# Patient Record
Sex: Male | Born: 1945 | Race: White | Hispanic: No | Marital: Single | Smoking: Former smoker
Health system: Southern US, Community
[De-identification: ages and names within clinical notes are randomized; demographics above are authoritative.]

## PROBLEM LIST (undated history)

## (undated) DIAGNOSIS — Z87442 Personal history of urinary calculi: Secondary | ICD-10-CM

## (undated) DIAGNOSIS — K219 Gastro-esophageal reflux disease without esophagitis: Secondary | ICD-10-CM

## (undated) DIAGNOSIS — I209 Angina pectoris, unspecified: Secondary | ICD-10-CM

## (undated) DIAGNOSIS — I10 Essential (primary) hypertension: Secondary | ICD-10-CM

## (undated) DIAGNOSIS — E785 Hyperlipidemia, unspecified: Secondary | ICD-10-CM

## (undated) DIAGNOSIS — H409 Unspecified glaucoma: Secondary | ICD-10-CM

## (undated) DIAGNOSIS — H90A12 Conductive hearing loss, unilateral, left ear with restricted hearing on the contralateral side: Secondary | ICD-10-CM

## (undated) DIAGNOSIS — R7303 Prediabetes: Secondary | ICD-10-CM

## (undated) DIAGNOSIS — K4090 Unilateral inguinal hernia, without obstruction or gangrene, not specified as recurrent: Secondary | ICD-10-CM

## (undated) DIAGNOSIS — I219 Acute myocardial infarction, unspecified: Secondary | ICD-10-CM

## (undated) DIAGNOSIS — I2510 Atherosclerotic heart disease of native coronary artery without angina pectoris: Secondary | ICD-10-CM

## (undated) DIAGNOSIS — K635 Polyp of colon: Secondary | ICD-10-CM

## (undated) DIAGNOSIS — Z955 Presence of coronary angioplasty implant and graft: Secondary | ICD-10-CM

## (undated) DIAGNOSIS — R972 Elevated prostate specific antigen [PSA]: Secondary | ICD-10-CM

## (undated) DIAGNOSIS — N400 Enlarged prostate without lower urinary tract symptoms: Secondary | ICD-10-CM

## (undated) HISTORY — PX: CARDIAC CATHETERIZATION: SHX172

## (undated) HISTORY — PX: OTHER SURGICAL HISTORY: SHX169

## (undated) HISTORY — PX: VASECTOMY: SHX75

---

## 2011-01-16 ENCOUNTER — Emergency Department: Payer: Self-pay | Admitting: *Deleted

## 2011-01-27 ENCOUNTER — Ambulatory Visit: Payer: Self-pay | Admitting: Internal Medicine

## 2011-05-08 ENCOUNTER — Ambulatory Visit: Payer: Self-pay

## 2011-05-19 ENCOUNTER — Ambulatory Visit: Payer: Self-pay | Admitting: Internal Medicine

## 2016-09-28 DIAGNOSIS — I219 Acute myocardial infarction, unspecified: Secondary | ICD-10-CM

## 2016-09-28 HISTORY — DX: Acute myocardial infarction, unspecified: I21.9

## 2017-04-28 DIAGNOSIS — R7302 Impaired glucose tolerance (oral): Secondary | ICD-10-CM | POA: Insufficient documentation

## 2017-05-01 ENCOUNTER — Encounter: Payer: Self-pay | Admitting: Internal Medicine

## 2017-05-01 ENCOUNTER — Inpatient Hospital Stay
Admission: EM | Admit: 2017-05-01 | Discharge: 2017-05-04 | DRG: 247 | Disposition: A | Discharge status: Home / Self Care | Payer: Medicare Other | Attending: Internal Medicine | Admitting: Internal Medicine

## 2017-05-01 ENCOUNTER — Emergency Department: Payer: Medicare Other

## 2017-05-01 ENCOUNTER — Encounter
Admission: EM | Disposition: A | Discharge status: Home / Self Care | Payer: Self-pay | Source: Home / Self Care | Attending: Internal Medicine

## 2017-05-01 DIAGNOSIS — I2109 ST elevation (STEMI) myocardial infarction involving other coronary artery of anterior wall: Secondary | ICD-10-CM | POA: Diagnosis present

## 2017-05-01 DIAGNOSIS — R7303 Prediabetes: Secondary | ICD-10-CM | POA: Diagnosis present

## 2017-05-01 DIAGNOSIS — I252 Old myocardial infarction: Secondary | ICD-10-CM | POA: Insufficient documentation

## 2017-05-01 DIAGNOSIS — Z7982 Long term (current) use of aspirin: Secondary | ICD-10-CM

## 2017-05-01 DIAGNOSIS — I10 Essential (primary) hypertension: Secondary | ICD-10-CM | POA: Diagnosis present

## 2017-05-01 DIAGNOSIS — I25119 Atherosclerotic heart disease of native coronary artery with unspecified angina pectoris: Secondary | ICD-10-CM | POA: Diagnosis present

## 2017-05-01 DIAGNOSIS — I429 Cardiomyopathy, unspecified: Secondary | ICD-10-CM

## 2017-05-01 DIAGNOSIS — I2510 Atherosclerotic heart disease of native coronary artery without angina pectoris: Secondary | ICD-10-CM

## 2017-05-01 DIAGNOSIS — E785 Hyperlipidemia, unspecified: Secondary | ICD-10-CM | POA: Diagnosis present

## 2017-05-01 DIAGNOSIS — Z8249 Family history of ischemic heart disease and other diseases of the circulatory system: Secondary | ICD-10-CM

## 2017-05-01 DIAGNOSIS — I255 Ischemic cardiomyopathy: Secondary | ICD-10-CM | POA: Diagnosis present

## 2017-05-01 DIAGNOSIS — I213 ST elevation (STEMI) myocardial infarction of unspecified site: Secondary | ICD-10-CM | POA: Diagnosis present

## 2017-05-01 HISTORY — PX: CORONARY/GRAFT ACUTE MI REVASCULARIZATION: CATH118305

## 2017-05-01 HISTORY — PX: CORONARY BALLOON ANGIOPLASTY: CATH118233

## 2017-05-01 HISTORY — PX: LEFT HEART CATH AND CORONARY ANGIOGRAPHY: CATH118249

## 2017-05-01 HISTORY — DX: ST elevation (STEMI) myocardial infarction involving other coronary artery of anterior wall: I21.09

## 2017-05-01 HISTORY — DX: Atherosclerotic heart disease of native coronary artery without angina pectoris: I25.10

## 2017-05-01 HISTORY — DX: Cardiomyopathy, unspecified: I42.9

## 2017-05-01 LAB — COMPREHENSIVE METABOLIC PANEL
ALK PHOS: 110 U/L (ref 38–126)
ALT: 22 U/L (ref 17–63)
ANION GAP: 9 (ref 5–15)
AST: 26 U/L (ref 15–41)
Albumin: 4.3 g/dL (ref 3.5–5.0)
BILIRUBIN TOTAL: 1 mg/dL (ref 0.3–1.2)
BUN: 24 mg/dL — ABNORMAL HIGH (ref 6–20)
CALCIUM: 9.6 mg/dL (ref 8.9–10.3)
CO2: 24 mmol/L (ref 22–32)
Chloride: 105 mmol/L (ref 101–111)
Creatinine, Ser: 1.04 mg/dL (ref 0.61–1.24)
GFR calc non Af Amer: >60 mL/min (ref >60)
Glucose, Bld: 105 mg/dL — ABNORMAL HIGH (ref 65–99)
POTASSIUM: 3.9 mmol/L (ref 3.5–5.1)
Sodium: 138 mmol/L (ref 135–145)
TOTAL PROTEIN: 7.9 g/dL (ref 6.5–8.1)

## 2017-05-01 LAB — CBC WITH DIFFERENTIAL/PLATELET
BASOS PCT: 1 %
Basophils Absolute: 0.1 10*3/uL (ref 0–0.1)
EOS ABS: 0.2 10*3/uL (ref 0–0.7)
EOS PCT: 2 %
HCT: 48.1 % (ref 40.0–52.0)
HEMOGLOBIN: 16.6 g/dL (ref 13.0–18.0)
LYMPHS PCT: 19 %
Lymphs Abs: 1.8 10*3/uL (ref 1.0–3.6)
MCH: 29.7 pg (ref 26.0–34.0)
MCHC: 34.5 g/dL (ref 32.0–36.0)
MCV: 86.1 fL (ref 80.0–100.0)
MONO ABS: 1 10*3/uL (ref 0.2–1.0)
MONOS PCT: 10 %
Neutro Abs: 6.5 10*3/uL (ref 1.4–6.5)
Neutrophils Relative %: 68 %
Platelets: 237 10*3/uL (ref 150–440)
RBC: 5.58 MIL/uL (ref 4.40–5.90)
RDW: 13.4 % (ref 11.5–14.5)
WBC: 9.5 10*3/uL (ref 3.8–10.6)

## 2017-05-01 LAB — LIPID PANEL
Cholesterol: 239 mg/dL — ABNORMAL HIGH (ref 0–200)
HDL: 36 mg/dL — AB (ref >40)
LDL CALC: 175 mg/dL — AB (ref 0–99)
TRIGLYCERIDES: 141 mg/dL (ref <150)
Total CHOL/HDL Ratio: 6.6 RATIO
VLDL: 28 mg/dL (ref 0–40)

## 2017-05-01 LAB — APTT: aPTT: 31 seconds (ref 24–36)

## 2017-05-01 LAB — TROPONIN I: Troponin I: 0.11 ng/mL (ref <0.03)

## 2017-05-01 LAB — PROTIME-INR
INR: 1.06
PROTHROMBIN TIME: 13.8 s (ref 11.4–15.2)

## 2017-05-01 LAB — POCT ACTIVATED CLOTTING TIME: ACTIVATED CLOTTING TIME: 384 s

## 2017-05-01 LAB — CARDIAC CATHETERIZATION: Cath EF Quantitative: 35 %

## 2017-05-01 SURGERY — LEFT HEART CATH AND CORONARY ANGIOGRAPHY
Anesthesia: Moderate Sedation

## 2017-05-01 MED ORDER — BIVALIRUDIN TRIFLUOROACETATE 250 MG IV SOLR
INTRAVENOUS | Status: AC
  Filled 2017-05-01: qty 250

## 2017-05-01 MED ORDER — ASPIRIN 81 MG PO CHEW
243.0000 mg | CHEWABLE_TABLET | Freq: Once | ORAL | Status: AC
  Administered 2017-05-01: 243 mg via ORAL

## 2017-05-01 MED ORDER — ONDANSETRON HCL 4 MG/2ML IJ SOLN: 4.0000 mg | Freq: Four times a day (QID) | INTRAMUSCULAR | Status: DC | PRN

## 2017-05-01 MED ORDER — SODIUM CHLORIDE 0.9 % IV SOLN
INTRAVENOUS | Status: AC | PRN
  Administered 2017-05-01: 1.75 mg/kg/h via INTRAVENOUS

## 2017-05-01 MED ORDER — NITROGLYCERIN 0.4 MG SL SUBL
0.4000 mg | SUBLINGUAL_TABLET | SUBLINGUAL | Status: DC | PRN
  Administered 2017-05-01 (×2): 0.4 mg via SUBLINGUAL

## 2017-05-01 MED ORDER — HEPARIN SODIUM (PORCINE) 5000 UNIT/ML IJ SOLN: 4000.0000 [IU] | Freq: Once | INTRAMUSCULAR | Status: DC

## 2017-05-01 MED ORDER — HEPARIN (PORCINE) IN NACL 100-0.45 UNIT/ML-% IJ SOLN
1100.0000 [IU]/h | INTRAMUSCULAR | Status: DC
  Administered 2017-05-01: 950 [IU]/h via INTRAVENOUS
  Filled 2017-05-01 (×2): qty 250

## 2017-05-01 MED ORDER — LISINOPRIL 10 MG PO TABS
5.0000 mg | ORAL_TABLET | Freq: Two times a day (BID) | ORAL | Status: DC
  Administered 2017-05-01 – 2017-05-04 (×6): 5 mg via ORAL
  Filled 2017-05-01: qty 0.5
  Filled 2017-05-01: qty 1
  Filled 2017-05-01 (×3): qty 0.5
  Filled 2017-05-01 (×3): qty 1
  Filled 2017-05-01: qty 0.5
  Filled 2017-05-01 (×2): qty 1
  Filled 2017-05-01: qty 0.5

## 2017-05-01 MED ORDER — METOPROLOL TARTRATE 5 MG/5ML IV SOLN
5.0000 mg | Freq: Once | INTRAVENOUS | Status: AC
  Administered 2017-05-01: 5 mg via INTRAVENOUS

## 2017-05-01 MED ORDER — ACETAMINOPHEN 325 MG PO TABS: 650.0000 mg | ORAL_TABLET | ORAL | Status: DC | PRN

## 2017-05-01 MED ORDER — ATORVASTATIN CALCIUM 20 MG PO TABS
80.0000 mg | ORAL_TABLET | Freq: Every day | ORAL | Status: DC
  Administered 2017-05-02 – 2017-05-03 (×2): 80 mg via ORAL
  Filled 2017-05-01 (×2): qty 4

## 2017-05-01 MED ORDER — SODIUM CHLORIDE 0.9 % IV SOLN
0.2500 mg/kg/h | INTRAVENOUS | Status: DC
  Administered 2017-05-01: 0.25 mg/kg/h via INTRAVENOUS
  Filled 2017-05-01: qty 250

## 2017-05-01 MED ORDER — ASPIRIN 81 MG PO CHEW: 243.0000 mg | CHEWABLE_TABLET | Freq: Once | ORAL | Status: DC

## 2017-05-01 MED ORDER — METOPROLOL TARTRATE 5 MG/5ML IV SOLN
INTRAVENOUS | Status: AC
  Filled 2017-05-01: qty 5

## 2017-05-01 MED ORDER — SODIUM CHLORIDE 0.9% FLUSH: 3.0000 mL | INTRAVENOUS | Status: DC | PRN

## 2017-05-01 MED ORDER — IOPAMIDOL (ISOVUE-300) INJECTION 61%
INTRAVENOUS | Status: DC | PRN
  Administered 2017-05-01: 215 mL via INTRA_ARTERIAL

## 2017-05-01 MED ORDER — NITROGLYCERIN 5 MG/ML IV SOLN
INTRAVENOUS | Status: AC
  Filled 2017-05-01: qty 10

## 2017-05-01 MED ORDER — LIDOCAINE HCL (PF) 1 % IJ SOLN
INTRAMUSCULAR | Status: AC
  Filled 2017-05-01: qty 30

## 2017-05-01 MED ORDER — SODIUM CHLORIDE 0.9 % IV SOLN: 250.0000 mL | INTRAVENOUS | Status: DC | PRN

## 2017-05-01 MED ORDER — SODIUM CHLORIDE 0.9 % IV SOLN
Freq: Once | INTRAVENOUS | Status: AC
  Administered 2017-05-01: 21:00:00 via INTRAVENOUS

## 2017-05-01 MED ORDER — HEPARIN (PORCINE) IN NACL 2-0.9 UNIT/ML-% IJ SOLN
INTRAMUSCULAR | Status: AC
  Filled 2017-05-01: qty 500

## 2017-05-01 MED ORDER — HYDRALAZINE HCL 20 MG/ML IJ SOLN: 5.0000 mg | INTRAMUSCULAR | Status: DC | PRN

## 2017-05-01 MED ORDER — SODIUM CHLORIDE 0.9 % IV SOLN: INTRAVENOUS | Status: DC

## 2017-05-01 MED ORDER — BIVALIRUDIN BOLUS VIA INFUSION - CUPID
INTRAVENOUS | Status: DC | PRN
  Administered 2017-05-01: 58.5 mg via INTRAVENOUS

## 2017-05-01 MED ORDER — LABETALOL HCL 5 MG/ML IV SOLN: 10.0000 mg | INTRAVENOUS | Status: DC | PRN

## 2017-05-01 MED ORDER — HEPARIN SODIUM (PORCINE) 5000 UNIT/ML IJ SOLN
4000.0000 [IU] | Freq: Once | INTRAMUSCULAR | Status: AC
  Administered 2017-05-01: 4000 [IU] via INTRAVENOUS

## 2017-05-01 MED ORDER — CLOPIDOGREL BISULFATE 75 MG PO TABS
75.0000 mg | ORAL_TABLET | Freq: Every day | ORAL | Status: DC
  Administered 2017-05-02 – 2017-05-04 (×3): 75 mg via ORAL
  Filled 2017-05-01 (×3): qty 1

## 2017-05-01 MED ORDER — METOPROLOL TARTRATE 25 MG PO TABS
50.0000 mg | ORAL_TABLET | Freq: Two times a day (BID) | ORAL | Status: DC
  Administered 2017-05-01 – 2017-05-03 (×4): 50 mg via ORAL
  Filled 2017-05-01 (×4): qty 2

## 2017-05-01 MED ORDER — CLOPIDOGREL BISULFATE 75 MG PO TABS
600.0000 mg | ORAL_TABLET | Freq: Once | ORAL | Status: AC
  Administered 2017-05-01: 600 mg via ORAL

## 2017-05-01 MED ORDER — ASPIRIN 81 MG PO CHEW
81.0000 mg | CHEWABLE_TABLET | Freq: Every day | ORAL | Status: DC
  Administered 2017-05-02 – 2017-05-04 (×3): 81 mg via ORAL
  Filled 2017-05-01 (×3): qty 1

## 2017-05-01 MED ORDER — SODIUM CHLORIDE 0.9 % WEIGHT BASED INFUSION
1.0000 mL/kg/h | INTRAVENOUS | Status: AC
  Administered 2017-05-02: 1 mL/kg/h via INTRAVENOUS

## 2017-05-01 MED ORDER — SODIUM CHLORIDE 0.9% FLUSH
3.0000 mL | Freq: Two times a day (BID) | INTRAVENOUS | Status: DC
  Administered 2017-05-02 – 2017-05-03 (×4): 3 mL via INTRAVENOUS

## 2017-05-01 SURGICAL SUPPLY — 15 items
BALLN TREK RX 3.0X12 (BALLOONS) ×3
BALLOON TREK RX 3.0X12 (BALLOONS) ×1 IMPLANT
CATH 5FR JR4 DIAGNOSTIC (CATHETERS) ×3 IMPLANT
CATH INFINITI 5FR ANG PIGTAIL (CATHETERS) ×3 IMPLANT
CATH LAUNCHER 6FR EBU3.5 (CATHETERS) ×3 IMPLANT
CATH PRIORITY ONE AC 6F (CATHETERS) ×3 IMPLANT
DEVICE CLOSURE MYNXGRIP 6/7F (Vascular Products) ×3 IMPLANT
DEVICE INFLAT 30 PLUS (MISCELLANEOUS) ×3 IMPLANT
KIT MANI 3VAL PERCEP (MISCELLANEOUS) ×3 IMPLANT
NEEDLE PERC 18GX7CM (NEEDLE) ×3 IMPLANT
PACK CARDIAC CATH (CUSTOM PROCEDURE TRAY) ×3 IMPLANT
SHEATH AVANTI 6FR X 11CM (SHEATH) ×3 IMPLANT
STENT XIENCE ALPINE RX 3.25X23 (Permanent Stent) ×3 IMPLANT
WIRE EMERALD 3MM-J .035X150CM (WIRE) ×3 IMPLANT
WIRE G HI TQ BMW 190 (WIRE) ×3 IMPLANT

## 2017-05-01 NOTE — ED Provider Notes (Signed)
[facility] Emergency Department Provider Note  ____________________________________________   First MD Initiated Contact with Patient 05/01/17 2057     (approximate)  I have reviewed the triage vital signs and the nursing notes.   HISTORY  Chief Complaint Chest Pain and Code STEMI   HPI Joseph Esparza is a 71 y.o. male with a strong family history of cardiac disease was presenting to the emergency department today with a pressure-like pain across the front of his chest and weakness in his bilateral arms. He says that the sensation is lasting for the past several hours. However, he has had on and off pressure similar to this since this past Thursday. He is denying any diaphoresis, nausea vomiting or shortness of breath. Denies smoking. Says that he is not taking any medications but only takes an occasional aspirin. He says that he took one aspirin earlier today. Says that the chest pressure is a 3 out of 10 at this time.   No past medical history on file.  There are no active problems to display for this patient.   No past surgical history on file.  Prior to Admission medications   Not on File    Allergies Patient has no known allergies.  No family history on file.  Social History Social History  Substance Use Topics  . Smoking status: Not on file  . Smokeless tobacco: Not on file  . Alcohol use Not on file    Review of Systems  Constitutional: No fever/chills Eyes: No visual changes. ENT: No sore throat. Cardiovascular:as above Respiratory: Denies shortness of breath. Gastrointestinal: No abdominal pain.  No nausea, no vomiting.  No diarrhea.  No constipation. Genitourinary: Negative for dysuria. Musculoskeletal: Negative for back pain. Skin: Negative for rash. Neurological: Negative for headaches, focal weakness or numbness.   ____________________________________________   PHYSICAL EXAM:  VITAL SIGNS: ED Triage Vitals  Enc  Vitals Group     BP 05/01/17 2051 (!) 198/105     Pulse Rate 05/01/17 2051 80     Resp 05/01/17 2055 (!) 22     Temp 05/01/17 2051 97.9 F (36.6 C)     Temp Source 05/01/17 2051 Oral     SpO2 05/01/17 2051 94 %     Weight 05/01/17 2059 172 lb (78 kg)     Height 05/01/17 2059 5\' 10"  (1.778 m)     Head Circumference --      Peak Flow --      Pain Score 05/01/17 2108 2     Pain Loc --      Pain Edu? --      Excl. in [place]? --     Constitutional: Alert and oriented. Well appearing and in no acute distress. Eyes: Conjunctivae are normal.  Head: Atraumatic. Nose: No congestion/rhinnorhea. Mouth/Throat: Mucous membranes are moist.  Neck: No stridor.   Cardiovascular: Normal rate, regular rhythm. Grossly normal heart sounds.   Respiratory: Normal respiratory effort.  No retractions. Lungs CTAB. Gastrointestinal: Soft and nontender. No distention. Musculoskeletal: No lower extremity tenderness nor edema.  No joint effusions. Neurologic:  Normal speech and language. No gross focal neurologic deficits are appreciated. Skin:  Skin is warm, dry and intact. No rash noted. Psychiatric: Mood and affect are normal. Speech and behavior are normal.  ____________________________________________   LABS (all labs ordered are listed, but only abnormal results are displayed)  Labs Reviewed  CBC WITH DIFFERENTIAL/PLATELET  PROTIME-INR  APTT  COMPREHENSIVE METABOLIC PANEL  TROPONIN I  LIPID  PANEL  CBC WITH DIFFERENTIAL/PLATELET   ____________________________________________  EKG  ED ECG REPORT I, Doran Stabler, the attending physician, personally viewed and interpreted this ECG.   Date: 05/01/2017  EKG Time: 2057  Rate: 86  Rhythm: normal sinus rhythm  Axis: Normal  Intervals:none  ST&T Change: ST segment elevation in 1, aVL, V2 and 3 with reciprocal depression in 3 and aVF.  ED ECG REPORT I, Doran Stabler, the attending physician, personally viewed and interpreted this  ECG.   Date: 05/01/2017  EKG Time: 2050  Rate: 84  Rhythm: normal sinus rhythm  Axis: Normal  Intervals:none  ST&T Change: ST depressions in II, III, and F aVF with elevations in aVL, V2 through V5 with tombstone morphology. Consistent with STEMI.     ____________________________________________  RADIOLOGY   ____________________________________________   PROCEDURES  Procedure(s) performed:   Procedures  Critical Care performed:  CRITICAL CARE Performed by: Doran Stabler   Total critical care time: 35 minutes  Critical care time was exclusive of separately billable procedures and treating other patients.  Critical care was necessary to treat or prevent imminent or life-threatening deterioration.  Critical care was time spent personally by me on the following activities: development of treatment plan with patient and/or surrogate as well as nursing, discussions with consultants, evaluation of patient's response to treatment, examination of patient, obtaining history from patient or surrogate, ordering and performing treatments and interventions, ordering and review of laboratory studies, ordering and review of radiographic studies, pulse oximetry and re-evaluation of patient's condition.   ____________________________________________   INITIAL IMPRESSION / ASSESSMENT AND PLAN / ED COURSE  Pertinent labs & imaging results that were available during my care of the patient were reviewed by me and considered in my medical decision making (see chart for details).  ----------------------------------------- 9:13 PM on 05/01/2017 -----------------------------------------  Discussed the case with Dr. Clayborn Bigness who will be taking the patient to the catheterization lab. He recommends heparin, Plavix, aspirin Lopressor and nitroglycerin. Patient aware of the diagnosis as well as Cath Lab disposition. He is understanding willing to comply.       ____________________________________________   FINAL CLINICAL IMPRESSION(S) / ED DIAGNOSES  STEMI    NEW MEDICATIONS STARTED DURING THIS VISIT:  New Prescriptions   No medications on file     Note:  This document was prepared using Dragon voice recognition software and may include unintentional dictation errors.     Orbie Pyo, MD 05/01/17 2113

## 2017-05-01 NOTE — Consult Note (Signed)
Reason for Consult: STEMI anterior wall acute Referring Physician: Dr. Dineen Kid emergency room, Dr. Estanislado Pandy hospitalist  Joseph Esparza is an 71 y.o. male.  HPI: 71 year old white male in past medical history nonsmoker started having stuttering chest pain over the last 2-3 days midsternal with diaphoresis shortness of breath fatigue, weakness. Patient states she's had progressive recurrent symptoms about an hour prior to presentation he got progressively worse atrial himself to the emergency room. Upon evaluation the patient was found to have significant ST elevation anteriorly in the emergency Dr. activated code STEMI.. For evaluation and consultation with the patient and patient agreed to proceed with cardiac catheter for further assessment and intervention for intervention. Patient's pain was mild-to-moderate time he wasn't short of breath and appeared to be hemodynamically stable except for elevated blood pressure initially was over 200 he was down to systolic of 409. Denied any blackout spells or syncope no nausea vomiting to small diaphoresis  No past medical history on file.  No past surgical history on file.  No family history on file.  Social History:  has no tobacco, alcohol, and drug history on file.  Allergies: No Known Allergies  Medications: I have reviewed the patient's current medications.  Results for orders placed or performed during the hospital encounter of 05/01/17 (from the past 48 hour(s))  CBC with Differential/Platelet     Status: None   Collection Time: 05/01/17  8:56 PM  Result Value Ref Range   WBC 9.5 3.8 - 10.6 K/uL   RBC 5.58 4.40 - 5.90 MIL/uL   Hemoglobin 16.6 13.0 - 18.0 g/dL   HCT 48.1 40.0 - 52.0 %   MCV 86.1 80.0 - 100.0 fL   MCH 29.7 26.0 - 34.0 pg   MCHC 34.5 32.0 - 36.0 g/dL   RDW 13.4 11.5 - 14.5 %   Platelets 237 150 - 440 K/uL   Neutrophils Relative % 68 %   Neutro Abs 6.5 1.4 - 6.5 K/uL   Lymphocytes Relative 19 %   Lymphs Abs 1.8 1.0 - 3.6  K/uL   Monocytes Relative 10 %   Monocytes Absolute 1.0 0.2 - 1.0 K/uL   Eosinophils Relative 2 %   Eosinophils Absolute 0.2 0 - 0.7 K/uL   Basophils Relative 1 %   Basophils Absolute 0.1 0 - 0.1 K/uL  Protime-INR     Status: None   Collection Time: 05/01/17  8:56 PM  Result Value Ref Range   Prothrombin Time 13.8 11.4 - 15.2 seconds   INR 1.06   APTT     Status: None   Collection Time: 05/01/17  8:56 PM  Result Value Ref Range   aPTT 31 24 - 36 seconds  Comprehensive metabolic panel     Status: Abnormal   Collection Time: 05/01/17  8:56 PM  Result Value Ref Range   Sodium 138 135 - 145 mmol/L   Potassium 3.9 3.5 - 5.1 mmol/L   Chloride 105 101 - 111 mmol/L   CO2 24 22 - 32 mmol/L   Glucose, Bld 105 (H) 65 - 99 mg/dL   BUN 24 (H) 6 - 20 mg/dL   Creatinine, Ser 1.04 0.61 - 1.24 mg/dL   Calcium 9.6 8.9 - 10.3 mg/dL   Total Protein 7.9 6.5 - 8.1 g/dL   Albumin 4.3 3.5 - 5.0 g/dL   AST 26 15 - 41 U/L   ALT 22 17 - 63 U/L   Alkaline Phosphatase 110 38 - 126 U/L   Total Bilirubin 1.0  0.3 - 1.2 mg/dL   GFR calc non Af Amer >60 >60 mL/min   GFR calc Af Amer >60 >60 mL/min    Comment: (NOTE) The eGFR has been calculated using the CKD EPI equation. This calculation has not been validated in all clinical situations. eGFR's persistently <60 mL/min signify possible Chronic Kidney Disease.    Anion gap 9 5 - 15  Troponin I     Status: Abnormal   Collection Time: 05/01/17  8:56 PM  Result Value Ref Range   Troponin I 0.11 (HH) <0.03 ng/mL    Comment: CRITICAL RESULT CALLED TO, READ BACK BY AND VERIFIED WITH DELICIA TYLER 01/[birth year]/79 @ 2159  [place]   Lipid panel     Status: Abnormal   Collection Time: 05/01/17  8:56 PM  Result Value Ref Range   Cholesterol 239 (H) 0 - 200 mg/dL   Triglycerides 141 <150 mg/dL   HDL 36 (L) >40 mg/dL   Total CHOL/HDL Ratio 6.6 RATIO   VLDL 28 0 - 40 mg/dL   LDL Cholesterol 175 (H) 0 - 99 mg/dL    Comment:        Total Cholesterol/HDL:CHD  Risk Coronary Heart Disease Risk Table                     Men   Women  1/2 Average Risk   3.4   3.3  Average Risk       5.0   4.4  2 X Average Risk   9.6   7.1  3 X Average Risk  23.4   11.0        Use the calculated Patient Ratio above and the CHD Risk Table to determine the patient's CHD Risk.        ATP III CLASSIFICATION (LDL):  <100     mg/dL   Optimal  100-129  mg/dL   Near or Above                    Optimal  130-159  mg/dL   Borderline  160-189  mg/dL   High  >190     mg/dL   Very High   POCT Activated clotting time     Status: None   Collection Time: 05/01/17 10:11 PM  Result Value Ref Range   Activated Clotting Time 384 seconds    Dg Chest Port 1 View  Result Date: 05/01/2017 CLINICAL DATA:  Acute onset of generalized chest pain. Initial encounter. EXAM: PORTABLE CHEST 1 VIEW COMPARISON:  None. FINDINGS: The lungs are well-aerated and clear. There is no evidence of focal opacification, pleural effusion or pneumothorax. The cardiomediastinal silhouette is mildly enlarged. No acute osseous abnormalities are seen. External pacing pads are noted. IMPRESSION: Mild cardiomegaly.  Lungs remain grossly clear. Electronically Signed   By: Garald Balding M.D.   On: 05/01/2017 21:25    Review of Systems  Constitutional: Positive for diaphoresis and malaise/fatigue.  HENT: Positive for congestion.   Eyes: Negative.   Respiratory: Positive for shortness of breath.   Cardiovascular: Positive for chest pain and palpitations.  Gastrointestinal: Negative.   Genitourinary: Negative.   Musculoskeletal: Negative.   Skin: Negative.   Neurological: Positive for weakness.  Endo/Heme/Allergies: Negative.   Psychiatric/Behavioral: Negative.    Blood pressure (!) 174/114, pulse 71, temperature 97.9 F (36.6 C), temperature source Oral, resp. rate 10, height '5\' 10"'$  (1.778 m), weight 78 kg (172 lb), SpO2 97 %. Physical Exam  Nursing note and vitals  reviewed. Constitutional: He is  oriented to person, place, and time. He appears well-developed and well-nourished.  HENT:  Head: Normocephalic and atraumatic.  Eyes: Pupils are equal, round, and reactive to light. Conjunctivae and EOM are normal.  Neck: Normal range of motion. Neck supple.  Cardiovascular: Normal rate and regular rhythm.  Exam reveals gallop.   Murmur heard. Respiratory: Effort normal and breath sounds normal.  GI: Soft. Bowel sounds are normal.  Musculoskeletal: Normal range of motion.  Neurological: He is alert and oriented to person, place, and time. He has normal reflexes.  Skin: Skin is warm and dry.  Psychiatric: He has a normal mood and affect.    Assessment/Plan: STEMI anterior wall acute Abnormal EKG Hypertension Coronary artery disease Ischemic cardiomyopathy . Plan Agree with admission for STEMI Proceed with cardiac catheter lab with intention to treat Beta-blockade therapy to help with hypertension Agree with anticoagulation initially with heparin Aspirin Plavix should be given prior to catheter Consider echocardiogram for further assessment Follow-up EKGs and troponins Recommend ACE inhibitor therapy for hypertension control Start Lipitor 80 mg once a day for lipid management Proceed directly to cardiac catheter with potential intervention with probably an LAD lesion   Dwayne D Callwood 05/01/2017, 11:00 PM

## 2017-05-01 NOTE — H&P (Addendum)
[name] NAME: Joseph Esparza    MR#:  [id number]  DATE OF BIRTH:  Jun 14, 1946  DATE OF ADMISSION:  05/01/2017  PRIMARY CARE PHYSICIAN: No primary care provider on file.   REQUESTING/REFERRING PHYSICIAN:   CHIEF COMPLAINT:   Chief Complaint  Patient presents with  . Chest Pain  . Code STEMI    HISTORY OF PRESENT ILLNESS: Joseph Esparza  is a 71 y.o. male with No significant past medical history presented to the emergency room with chest pain. Patient has this started having chest pain for the last 2-3 days located midsternally patient was diaphoretic and short of breath when he had this chest pain. The chest pain is sharp in nature and was 7 out of 10 on a scale of 1-10 initially. He was evaluated in the emergency room was found to have ST elevation MI. Code STEMI was called and patient was taken to cardiac catheter lab and cardiac catheterization was done by cardiology. Patient had stent placed in a LAD. The chest tightness and pain have resolved. Hospitalist service was consulted.  PAST MEDICAL HISTORY:  No past medical history on file.  PAST SURGICAL HISTORY: Past Surgical History:  Procedure Laterality Date  . none      SOCIAL HISTORY:  Social History  Substance Use Topics  . Smoking status: Never Smoker  . Smokeless tobacco: Never Used  . Alcohol use 1.8 oz/week    3 Cans of beer per week    FAMILY HISTORY:  Family History  Problem Relation Age of Onset  . Heart disease Mother   . Heart disease Father     DRUG ALLERGIES: No Known Allergies  REVIEW OF SYSTEMS:   CONSTITUTIONAL: No fever, fatigue or weakness.  EYES: No blurred or double vision.  EARS, NOSE, AND THROAT: No tinnitus or ear pain.  RESPIRATORY: No cough, shortness of breath, wheezing or hemoptysis.  CARDIOVASCULAR: Has chest pain,  No orthopnea, edema.  GASTROINTESTINAL: No nausea, vomiting, diarrhea or abdominal pain.  GENITOURINARY: No  dysuria, hematuria.  ENDOCRINE: No polyuria, nocturia,  HEMATOLOGY: No anemia, easy bruising or bleeding SKIN: No rash or lesion. MUSCULOSKELETAL: No joint pain or arthritis.   NEUROLOGIC: No tingling, numbness, weakness.  PSYCHIATRY: No anxiety or depression.   MEDICATIONS AT HOME:  Prior to Admission medications   Medication Sig Start Date End Date Taking? Authorizing Provider  aspirin 325 MG tablet Take 325 mg by mouth daily as needed.   Yes [provider]      PHYSICAL EXAMINATION:   VITAL SIGNS: Blood pressure 135/79, pulse (!) 44, temperature 98.3 F (36.8 C), temperature source Oral, resp. rate (!) 22, height 5\' 10"  (1.778 m), weight 78 kg (172 lb), SpO2 99 %.  GENERAL:  71 y.o.-year-old patient lying in the bed with no acute distress.  EYES: Pupils equal, round, reactive to light and accommodation. No scleral icterus. Extraocular muscles intact.  HEENT: Head atraumatic, normocephalic. Oropharynx and nasopharynx clear.  NECK:  Supple, no jugular venous distention. No thyroid enlargement, no tenderness.  LUNGS: Normal breath sounds bilaterally, no wheezing, rales,rhonchi or crepitation. No use of accessory muscles of respiration.  CARDIOVASCULAR: S1, S2 normal. No murmurs, rubs, or gallops.  ABDOMEN: Soft, nontender, nondistended. Bowel sounds present. No organomegaly or mass.  EXTREMITIES: No pedal edema, cyanosis, or clubbing.  NEUROLOGIC: Cranial nerves II through XII are intact. Muscle strength 5/5 in all extremities. Sensation intact. Gait not checked.  PSYCHIATRIC: The patient is  alert and oriented x 3.  SKIN: No obvious rash, lesion, or ulcer.   LABORATORY PANEL:   CBC  Recent Labs Lab 05/01/17 2056  WBC 9.5  HGB 16.6  HCT 48.1  PLT 237  MCV 86.1  MCH 29.7  MCHC 34.5  RDW 13.4  LYMPHSABS 1.8  MONOABS 1.0  EOSABS 0.2  BASOSABS 0.1    ------------------------------------------------------------------------------------------------------------------  Chemistries   Recent Labs Lab 05/01/17 2056  NA 138  K 3.9  CL 105  CO2 24  GLUCOSE 105*  BUN 24*  CREATININE 1.04  CALCIUM 9.6  AST 26  ALT 22  ALKPHOS 110  BILITOT 1.0   ------------------------------------------------------------------------------------------------------------------ estimated creatinine clearance is 65.3 mL/min (by C-G formula based on SCr of 1.04 mg/dL). ------------------------------------------------------------------------------------------------------------------ No results for input(s): TSH, T4TOTAL, T3FREE, THYROIDAB in the last 72 hours.  Invalid input(s): FREET3   Coagulation profile  Recent Labs Lab 05/01/17 2056  INR 1.06   ------------------------------------------------------------------------------------------------------------------- No results for input(s): DDIMER in the last 72 hours. -------------------------------------------------------------------------------------------------------------------  Cardiac Enzymes  Recent Labs Lab 05/01/17 2056  TROPONINI 0.11*   ------------------------------------------------------------------------------------------------------------------ Invalid input(s): POCBNP  ---------------------------------------------------------------------------------------------------------------  Urinalysis No results found for: COLORURINE, APPEARANCEUR, LABSPEC, PHURINE, GLUCOSEU, HGBUR, BILIRUBINUR, KETONESUR, PROTEINUR, UROBILINOGEN, NITRITE, LEUKOCYTESUR   RADIOLOGY: Dg Chest Port 1 View  Result Date: 05/01/2017 CLINICAL DATA:  Acute onset of generalized chest pain. Initial encounter. EXAM: PORTABLE CHEST 1 VIEW COMPARISON:  None. FINDINGS: The lungs are well-aerated and clear. There is no evidence of focal opacification, pleural effusion or pneumothorax. The cardiomediastinal  silhouette is mildly enlarged. No acute osseous abnormalities are seen. External pacing pads are noted. IMPRESSION: Mild cardiomegaly.  Lungs remain grossly clear. Electronically Signed   By: Garald Balding M.D.   On: 05/01/2017 21:25    EKG: Orders placed or performed during the hospital encounter of 05/01/17  . EKG 12-Lead  . EKG 12-Lead  . EKG 12-Lead  . EKG 12-Lead  . ED EKG  . ED EKG  . EKG 12-Lead immediately post procedure  . EKG 12-Lead  . EKG 12-Lead  . EKG 12-Lead immediately post procedure  . EKG 12-Lead  . EKG 12-Lead    IMPRESSION AND PLAN: 71 year old male patient with no significant past medical history presented to the emergency room with chest pain. Admitting diagnosis 1. ST elevation myocardial infarction 2. Family history of heart disease Treatment plan Admit patient to ICU Status post cardiac catheterization Continue aspirin, Plavix Continue bivalirudin Cardiology follow-up Cycle troponin Intensivist on call informed   All the records are reviewed and case discussed with ED provider. Management plans discussed with the patient, family and they are in agreement.  CODE STATUS:FULL CODE    Code Status Orders        Start     Ordered   05/01/17 2335  Full code  Continuous     05/01/17 2334    Code Status History    Date Active Date Inactive Code Status Order ID Comments User Context   This patient has a current code status but no historical code status.       TOTAL TIME TAKING CARE OF THIS PATIENT: 53 minutes.    Saundra Shelling M.D on 05/01/2017 at 11:56 PM  Between 7am to 6pm - Pager - [phone]  After 6pm go to [email or web address] - password [email or web address] [facility]  [facility]  Office  [phone]  CC: Primary care physician; No primary care provider on file.

## 2017-05-01 NOTE — Progress Notes (Signed)
[place] received a Pg for a code stemi. [place] reported to ED14 and meet PT. PT stated that he did not need my assistance. Ch told PT that if he needed me to jus   05/01/17 2055  Clinical Encounter Type  Visited With Patient  Visit Type Code  Referral From Nurse  Consult/Referral To Chaplain  Spiritual Encounters  Spiritual Needs Prayer  t let the nurse know. [place] prayed silently and left room.

## 2017-05-01 NOTE — ED Notes (Signed)
Pt's wishes spouse Andres Bantz to be contacted in case of emergency at [phone]

## 2017-05-01 NOTE — ED Triage Notes (Signed)
Pt is code stemi, pt began experiencing chest pressure on Thursday. Pt with pwd skin, no resp distress.

## 2017-05-01 NOTE — ED Notes (Signed)
Dr. Clayborn Bigness arrived, pt moved to cath lab with rn.

## 2017-05-01 NOTE — Progress Notes (Signed)
ANTICOAGULATION CONSULT NOTE - Initial Consult  Pharmacy Consult for heparin Indication: ACS  No Known Allergies  Patient Measurements: Height: 5\' 10"  (177.8 cm) Weight: 172 lb (78 kg) IBW/kg (Calculated) : 71 Heparin Dosing Weight: 78 kg  Vital Signs: Temp: 97.9 F (36.6 C) (08/04 2051) Temp Source: Oral (08/04 2051) BP: 198/105 (08/04 2051) Pulse Rate: 86 (08/04 2055)  Labs:  Recent Labs  05/01/17 2056  HGB 16.6  HCT 48.1  PLT 237    CrCl cannot be calculated (No order found.).   Medical History: No past medical history on file.  Medications:  Infusions:  . sodium chloride    . heparin      Assessment: 71 yom cc CP/code STEMI. Pharmacy consulted to dose heparin for ACS. No PTA OAC listed.  Goal of Therapy:  Heparin level 0.3-0.7 units/ml Monitor platelets by anticoagulation protocol: Yes   Plan:  Give 4000 units bolus x 1 Start heparin infusion at 950 units/hr Check anti-Xa level in 8 hours and daily while on heparin Continue to monitor H&H and platelets  Laural Benes, Pharm.D., BCPS Clinical Pharmacist 05/01/2017,9:15 PM

## 2017-05-01 NOTE — ED Notes (Signed)
Pt's EKG taken to Dr Clearnce Hasten reading ACUTE MI;

## 2017-05-02 ENCOUNTER — Inpatient Hospital Stay
Admit: 2017-05-02 | Discharge: 2017-05-02 | Disposition: A | Discharge status: Home / Self Care | Payer: Medicare Other | Attending: Internal Medicine | Admitting: Internal Medicine

## 2017-05-02 LAB — CBC
HCT: 44.4 % (ref 40.0–52.0)
Hemoglobin: 15.3 g/dL (ref 13.0–18.0)
MCH: 30.1 pg (ref 26.0–34.0)
MCHC: 34.5 g/dL (ref 32.0–36.0)
MCV: 87.3 fL (ref 80.0–100.0)
PLATELETS: 211 10*3/uL (ref 150–440)
RBC: 5.09 MIL/uL (ref 4.40–5.90)
RDW: 13.4 % (ref 11.5–14.5)
WBC: 12.8 10*3/uL — ABNORMAL HIGH (ref 3.8–10.6)

## 2017-05-02 LAB — HEPATIC FUNCTION PANEL
ALT: 56 U/L (ref 17–63)
AST: 275 U/L — ABNORMAL HIGH (ref 15–41)
Albumin: 3.7 g/dL (ref 3.5–5.0)
Alkaline Phosphatase: 93 U/L (ref 38–126)
BILIRUBIN INDIRECT: 1.3 mg/dL — AB (ref 0.3–0.9)
Bilirubin, Direct: 0.2 mg/dL (ref 0.1–0.5)
TOTAL PROTEIN: 6.7 g/dL (ref 6.5–8.1)
Total Bilirubin: 1.5 mg/dL — ABNORMAL HIGH (ref 0.3–1.2)

## 2017-05-02 LAB — ECHOCARDIOGRAM COMPLETE
HEIGHTINCHES: 70 in
WEIGHTICAEL: 2752 [oz_av]

## 2017-05-02 LAB — BASIC METABOLIC PANEL
Anion gap: 7 (ref 5–15)
BUN: 19 mg/dL (ref 6–20)
CALCIUM: 8.8 mg/dL — AB (ref 8.9–10.3)
CO2: 23 mmol/L (ref 22–32)
CREATININE: 0.82 mg/dL (ref 0.61–1.24)
Chloride: 107 mmol/L (ref 101–111)
GFR calc non Af Amer: >60 mL/min (ref >60)
Glucose, Bld: 114 mg/dL — ABNORMAL HIGH (ref 65–99)
Potassium: 3.8 mmol/L (ref 3.5–5.1)
SODIUM: 137 mmol/L (ref 135–145)

## 2017-05-02 LAB — TROPONIN I: Troponin I: >65 ng/mL (ref <0.03)

## 2017-05-02 LAB — MRSA PCR SCREENING: MRSA BY PCR: NEGATIVE

## 2017-05-02 LAB — HEPARIN LEVEL (UNFRACTIONATED)
HEPARIN UNFRACTIONATED: 0.28 [IU]/mL — AB (ref 0.30–0.70)
HEPARIN UNFRACTIONATED: 0.41 [IU]/mL (ref 0.30–0.70)

## 2017-05-02 MED ORDER — HEPARIN BOLUS VIA INFUSION
1200.0000 [IU] | Freq: Once | INTRAVENOUS | Status: AC
  Administered 2017-05-02: 1200 [IU] via INTRAVENOUS
  Filled 2017-05-02: qty 1200

## 2017-05-02 NOTE — Progress Notes (Signed)
Subjective:  Denies cp or sob. Pt feels well . Denies groin issues.  Objective:  Vital Signs in the last 24 hours: Temp:  [97.9 F (36.6 C)-99.3 F (37.4 C)] 99.3 F (37.4 C) (08/05 2000) Pulse Rate:  [62-71] 66 (08/05 2000) Resp:  [11-28] 16 (08/05 1800) BP: (101-166)/(70-98) 121/73 (08/05 2112) SpO2:  [97 %-100 %] 97 % (08/05 2000)  Intake/Output from previous day: 08/04 0701 - 08/05 0700 In: -  Out: 50 [Urine:50] Intake/Output from this shift: No intake/output data recorded.  Physical Exam: General appearance: appears stated age Neck: no adenopathy, no carotid bruit, no JVD, supple, symmetrical, trachea midline and thyroid not enlarged, symmetric, no tenderness/mass/nodules Lungs: clear to auscultation bilaterally Heart: regular rate and rhythm, S1, S2 normal, no murmur, click, rub or gallop Abdomen: soft, non-tender; bowel sounds normal; no masses,  no organomegaly Extremities: extremities normal, atraumatic, no cyanosis or edema Pulses: 2+ and symmetric Skin: Skin color, texture, turgor normal. No rashes or lesions Neurologic: Alert and oriented X 3, normal strength and tone. Normal symmetric reflexes. Normal coordination and gait  Lab Results:  Recent Labs  05/01/17 2056 05/02/17 0600  WBC 9.5 12.8*  HGB 16.6 15.3  PLT 237 211    Recent Labs  05/01/17 2056 05/02/17 0600  NA 138 137  K 3.9 3.8  CL 105 107  CO2 24 23  GLUCOSE 105* 114*  BUN 24* 19  CREATININE 1.04 0.82    Recent Labs  05/02/17 0849 05/02/17 1532  TROPONINI >65.00* >65.00*   Hepatic Function Panel  Recent Labs  05/02/17 0600  PROT 6.7  ALBUMIN 3.7  AST 275*  ALT 56  ALKPHOS 93  BILITOT 1.5*  BILIDIR 0.2  IBILI 1.3*    Recent Labs  05/01/17 2056  CHOL 239*   No results for input(s): PROTIME in the last 72 hours.  Imaging: Imaging results have been reviewed  Cardiac Studies:  Assessment/Plan:  STEMI Ant post o day 1 Angina Cardiomyopathy Chest  Pain Coronary Artery Disease Ischemic Heart Disease  HTN S/P PCI stent DES . PLAN Continue asa/Plavix post PCI stent DES B-blockers po post MI/HTN Agree with ACE for post MI and HTN Increase activity amulate in hall Continue with Statin therapy with lipitor Possible d/c home Tuesday Refer to cardiac rehab  LOS: 1 day    Joseph Esparza D Joseph Esparza 05/02/2017, 11:36 PM

## 2017-05-02 NOTE — Progress Notes (Signed)
eLink Physician-Brief Progress Note Patient Name: Joseph Esparza DOB: 1946-07-21 MRN: [id number]   Date of Service  05/02/2017  HPI/Events of Note  9 M with no major PMH presenting [facility] ED with SSCP/diaphoresis.  Found to have ST elevation consistent with STEMI.  To cath lab where stent to LAD was placed with resolution of CP.  Now in ICU post-procedure.  Camera check shows the patient to be in NAD with HR 65, BP 156/96, sats of 97% and RR of 21.  He is currently on angiomax and heparin  eICU Interventions  Plan of care per cardiology and primary admitting team Continue to monitor via [facility]     Intervention Category Evaluation Type: New Patient Evaluation  Alondra Sahni 05/02/2017, 12:47 AM

## 2017-05-02 NOTE — Progress Notes (Signed)
[place] at [place] NAME: Joseph Esparza    MR#:  [id number]  DATE OF BIRTH:  07-Mar-1946  SUBJECTIVE:  CHIEF COMPLAINT:   Chief Complaint  Patient presents with  . Chest Pain  . Code STEMI     Came with chest tightness and found to have ST elevation MI, LAD stent is placed.  No complaints today.  REVIEW OF SYSTEMS:  CONSTITUTIONAL: No fever, fatigue or weakness.  EYES: No blurred or double vision.  EARS, NOSE, AND THROAT: No tinnitus or ear pain.  RESPIRATORY: No cough, shortness of breath, wheezing or hemoptysis.  CARDIOVASCULAR: No chest pain, orthopnea, edema.  GASTROINTESTINAL: No nausea, vomiting, diarrhea or abdominal pain.  GENITOURINARY: No dysuria, hematuria.  ENDOCRINE: No polyuria, nocturia,  HEMATOLOGY: No anemia, easy bruising or bleeding SKIN: No rash or lesion. MUSCULOSKELETAL: No joint pain or arthritis.   NEUROLOGIC: No tingling, numbness, weakness.  PSYCHIATRY: No anxiety or depression.   ROS  DRUG ALLERGIES:  No Known Allergies  VITALS:  Blood pressure 113/86, pulse 65, temperature 98 F (36.7 C), temperature source Axillary, resp. rate 19, height 5\' 10"  (1.778 m), weight 78 kg (172 lb), SpO2 97 %.  PHYSICAL EXAMINATION:  GENERAL:  71 y.o.-year-old patient lying in the bed with no acute distress.  EYES: Pupils equal, round, reactive to light and accommodation. No scleral icterus. Extraocular muscles intact.  HEENT: Head atraumatic, normocephalic. Oropharynx and nasopharynx clear.  NECK:  Supple, no jugular venous distention. No thyroid enlargement, no tenderness.  LUNGS: Normal breath sounds bilaterally, no wheezing, rales,rhonchi or crepitation. No use of accessory muscles of respiration.  CARDIOVASCULAR: S1, S2 normal. No murmurs, rubs, or gallops.  ABDOMEN: Soft, nontender, nondistended. Bowel sounds present. No organomegaly or mass.  EXTREMITIES: No pedal edema, cyanosis, or clubbing.  NEUROLOGIC: Cranial  nerves II through XII are intact. Muscle strength 5/5 in all extremities. Sensation intact. Gait not checked.  PSYCHIATRIC: The patient is alert and oriented x 3.  SKIN: No obvious rash, lesion, or ulcer.   Physical Exam LABORATORY PANEL:   CBC  Recent Labs Lab 05/02/17 0600  WBC 12.8*  HGB 15.3  HCT 44.4  PLT 211   ------------------------------------------------------------------------------------------------------------------  Chemistries   Recent Labs Lab 05/02/17 0600  NA 137  K 3.8  CL 107  CO2 23  GLUCOSE 114*  BUN 19  CREATININE 0.82  CALCIUM 8.8*  AST 275*  ALT 56  ALKPHOS 93  BILITOT 1.5*   ------------------------------------------------------------------------------------------------------------------  Cardiac Enzymes  Recent Labs Lab 05/02/17 0311 05/02/17 0849  TROPONINI >65.00* >65.00*   ------------------------------------------------------------------------------------------------------------------  RADIOLOGY:  Dg Chest Port 1 View  Result Date: 05/01/2017 CLINICAL DATA:  Acute onset of generalized chest pain. Initial encounter. EXAM: PORTABLE CHEST 1 VIEW COMPARISON:  None. FINDINGS: The lungs are well-aerated and clear. There is no evidence of focal opacification, pleural effusion or pneumothorax. The cardiomediastinal silhouette is mildly enlarged. No acute osseous abnormalities are seen. External pacing pads are noted. IMPRESSION: Mild cardiomegaly.  Lungs remain grossly clear. Electronically Signed   By: Garald Balding M.D.   On: 05/01/2017 21:25    ASSESSMENT AND PLAN:   Active Problems:   STEMI (ST elevation myocardial infarction) ([place])   * ST elevation MI   Stent placed in mid LAD.   Aspirin, Plavix, atorvastatin, lisinopril, metoprolol.   Echocardiogram to be followed.  * Hyperlipidemia   LDL is high, start atorvastatin.   Add hemoglobin A1c.  * Hypertension   Metoprolol and  lisinopril.  * Cardiomyopathy   As per  cardiac catheter result he is 35-40%, echocardiogram is still elevated.   Lisinopril for now and follow in cardiology clinic.  All the records are reviewed and case discussed with Care Management/Social Workerr. Management plans discussed with the patient, family and they are in agreement.  CODE STATUS: full.  TOTAL TIME TAKING CARE OF THIS PATIENT: 35 minutes.     POSSIBLE D/C IN 1-2 DAYS, DEPENDING ON CLINICAL CONDITION.   Vaughan Basta M.D on 05/02/2017   Between 7am to 6pm - Pager - [phone]  After 6pm go to [email or web address] - password [email or web address] [place]  Office  [phone]  CC: Primary care physician; No primary care provider on file.  Note: This dictation was prepared with Dragon dictation along with smaller phrase technology. Any transcriptional errors that result from this process are unintentional.

## 2017-05-02 NOTE — Progress Notes (Signed)
EKG performed and showed Acute MI/STEMI. Pt pulses are 65bmp, B/P 154/93, no chest pain and pt shows no signs of distress. Notified Elink and Dr. Clayborn Bigness of the changes.Will continue to monitor.

## 2017-05-02 NOTE — Consult Note (Signed)
Pt with STEMI, S/P PTCA/DES. Uncomplicated course. Hemodynamically stable. Anterior ST elevation improving. Will order transfer to Telemetry unit. PCCM will sign off.   Merton Border, MD PCCM service Mobile [phone] Pager [phone] 05/02/2017 8:57 AM

## 2017-05-02 NOTE — Progress Notes (Signed)
ANTICOAGULATION CONSULT NOTE   Pharmacy Consult for heparin Indication: ACS  No Known Allergies  Patient Measurements: Height: 5\' 10"  (177.8 cm) Weight: 172 lb (78 kg) IBW/kg (Calculated) : 73 Heparin Dosing Weight: 78 kg  Vital Signs: Temp: 98.6 F (37 C) (08/05 0400) Temp Source: Oral (08/05 0400) BP: 155/91 (08/05 0700) Pulse Rate: 64 (08/05 0700)  Labs:  Recent Labs  05/01/17 2056 05/02/17 0311 05/02/17 0600  HGB 16.6  --  15.3  HCT 48.1  --  44.4  PLT 237  --  211  APTT 31  --   --   LABPROT 13.8  --   --   INR 1.06  --   --   HEPARINUNFRC  --   --  0.28*  CREATININE 1.04  --  0.82  TROPONINI 0.11* >65.00*  --     Estimated Creatinine Clearance: 82.8 mL/min (by C-G formula based on SCr of 0.82 mg/dL).   Medical History: No past medical history on file.  Medications:  Infusions:  . sodium chloride    . sodium chloride 1 mL/kg/hr (05/02/17 0125)  . heparin 950 Units/hr (05/01/17 2318)    Assessment: 1 yom cc CP/code STEMI. Pharmacy consulted to dose heparin for ACS. No PTA OAC listed.  Goal of Therapy:  Heparin level 0.3-0.7 units/ml Monitor platelets by anticoagulation protocol: Yes   Plan:  Give 4000 units bolus x 1 Start heparin infusion at 950 units/hr Check anti-Xa level in 8 hours and daily while on heparin Continue to monitor H&H and platelets   8/5: HL@0600 = 0.28.  Will order Heparin bolus of 1200 units and increase drip rate to 1100 units/hr. Recheck Heparin level at 1530.  Jimie Kuwahara A, Pharm.D., BCPS Clinical Pharmacist 05/02/2017,7:28 AM

## 2017-05-02 NOTE — Progress Notes (Addendum)
Pt has had no CP this shift, NSR exc one short run ventricular rhythm this AM, troponins > 65.  Dr Clayborn Bigness aware.  VS stable, BP much better controlled with oral meds this AM, pt is on room air.  Had bath and up to chair x 2 hours this shift, PO intake excellent.  Continent of urine, no BM.

## 2017-05-02 NOTE — Progress Notes (Signed)
ANTICOAGULATION CONSULT NOTE   Pharmacy Consult for heparin Indication: ACS  No Known Allergies  Patient Measurements: Height: 5\' 10"  (177.8 cm) Weight: 172 lb (78 kg) IBW/kg (Calculated) : 73 Heparin Dosing Weight: 78 kg  Vital Signs: Temp: 98.2 F (36.8 C) (08/05 1600) Temp Source: Axillary (08/05 1600) BP: 124/77 (08/05 1600) Pulse Rate: 64 (08/05 1600)  Labs:  Recent Labs  05/01/17 2056 05/02/17 0311 05/02/17 0600 05/02/17 0849 05/02/17 1532  HGB 16.6  --  15.3  --   --   HCT 48.1  --  44.4  --   --   PLT 237  --  211  --   --   APTT 31  --   --   --   --   LABPROT 13.8  --   --   --   --   INR 1.06  --   --   --   --   HEPARINUNFRC  --   --  0.28*  --  0.41  CREATININE 1.04  --  0.82  --   --   TROPONINI 0.11* >65.00*  --  >65.00* >65.00*    Estimated Creatinine Clearance: 82.8 mL/min (by C-G formula based on SCr of 0.82 mg/dL).   Medical History: History reviewed. No pertinent past medical history.  Medications:  Infusions:  . sodium chloride    . heparin 1,100 Units/hr (05/02/17 1500)    Assessment: 21 yom cc CP/code STEMI. Pharmacy consulted to dose heparin for ACS. No PTA OAC listed.  Goal of Therapy:  Heparin level 0.3-0.7 units/ml Monitor platelets by anticoagulation protocol: Yes   Plan:  Give 4000 units bolus x 1 Start heparin infusion at 950 units/hr Check anti-Xa level in 8 hours and daily while on heparin Continue to monitor H&H and platelets   8/5: HL@0600 = 0.28.  Will order Heparin bolus of 1200 units and increase drip rate to 1100 units/hr. Recheck Heparin level at 1530.  8/5: HL@1532 =0.41. Will continue current rate and check a confirmatory level in 8 hours.  Ishaaq Penna A, Pharm.D., BCPS Clinical Pharmacist 05/02/2017,4:55 PM

## 2017-05-03 ENCOUNTER — Encounter: Payer: Self-pay | Admitting: Internal Medicine

## 2017-05-03 DIAGNOSIS — I213 ST elevation (STEMI) myocardial infarction of unspecified site: Secondary | ICD-10-CM

## 2017-05-03 LAB — BASIC METABOLIC PANEL
Anion gap: 7 (ref 5–15)
BUN: 20 mg/dL (ref 6–20)
CHLORIDE: 104 mmol/L (ref 101–111)
CO2: 23 mmol/L (ref 22–32)
Calcium: 8.7 mg/dL — ABNORMAL LOW (ref 8.9–10.3)
Creatinine, Ser: 0.86 mg/dL (ref 0.61–1.24)
Glucose, Bld: 108 mg/dL — ABNORMAL HIGH (ref 65–99)
POTASSIUM: 3.6 mmol/L (ref 3.5–5.1)
SODIUM: 134 mmol/L — AB (ref 135–145)

## 2017-05-03 LAB — CBC
HCT: 42.3 % (ref 40.0–52.0)
Hemoglobin: 14.3 g/dL (ref 13.0–18.0)
MCH: 29.9 pg (ref 26.0–34.0)
MCHC: 33.8 g/dL (ref 32.0–36.0)
MCV: 88.4 fL (ref 80.0–100.0)
Platelets: 188 10*3/uL (ref 150–440)
RBC: 4.79 MIL/uL (ref 4.40–5.90)
RDW: 13.3 % (ref 11.5–14.5)
WBC: 11.9 10*3/uL — AB (ref 3.8–10.6)

## 2017-05-03 LAB — MAGNESIUM: Magnesium: 1.9 mg/dL (ref 1.7–2.4)

## 2017-05-03 LAB — PHOSPHORUS: PHOSPHORUS: 2.5 mg/dL (ref 2.5–4.6)

## 2017-05-03 LAB — GLUCOSE, CAPILLARY: GLUCOSE-CAPILLARY: 109 mg/dL — AB (ref 65–99)

## 2017-05-03 MED ORDER — POTASSIUM CHLORIDE 20 MEQ PO PACK
40.0000 meq | PACK | Freq: Once | ORAL | Status: AC
  Administered 2017-05-03: 40 meq via ORAL
  Filled 2017-05-03: qty 2

## 2017-05-03 MED ORDER — METOPROLOL TARTRATE 25 MG PO TABS: 25.0000 mg | ORAL_TABLET | Freq: Two times a day (BID) | ORAL | Status: DC

## 2017-05-03 MED ORDER — METOPROLOL TARTRATE 25 MG PO TABS
12.5000 mg | ORAL_TABLET | Freq: Two times a day (BID) | ORAL | Status: DC
Start: 2017-05-03 — End: 2017-05-04
  Administered 2017-05-03 – 2017-05-04 (×2): 12.5 mg via ORAL
  Filled 2017-05-03 (×2): qty 1

## 2017-05-03 MED ORDER — MAGNESIUM SULFATE IN D5W 1-5 GM/100ML-% IV SOLN
1.0000 g | Freq: Once | INTRAVENOUS | Status: AC
  Administered 2017-05-03: 1 g via INTRAVENOUS
  Filled 2017-05-03: qty 100

## 2017-05-03 NOTE — Progress Notes (Signed)
Joseph Esparza at [place] NAME: Joseph Esparza    MR#:  [id number]  DATE OF BIRTH:  Jun 30, 1946  SUBJECTIVE:  CHIEF COMPLAINT:   Chief Complaint  Patient presents with  . Chest Pain  . Code STEMI  , Sitting in the chair, blood pressure low. bradycardic as well, not symptomatic  REVIEW OF SYSTEMS:  CONSTITUTIONAL: No fever, fatigue or weakness.  EYES: No blurred or double vision.  EARS, NOSE, AND THROAT: No tinnitus or ear pain.  RESPIRATORY: No cough, shortness of breath, wheezing or hemoptysis.  CARDIOVASCULAR: No chest pain, orthopnea, edema.  GASTROINTESTINAL: No nausea, vomiting, diarrhea or abdominal pain.  GENITOURINARY: No dysuria, hematuria.  ENDOCRINE: No polyuria, nocturia,  HEMATOLOGY: No anemia, easy bruising or bleeding SKIN: No rash or lesion. MUSCULOSKELETAL: No joint pain or arthritis.   NEUROLOGIC: No tingling, numbness, weakness.  PSYCHIATRY: No anxiety or depression.   ROS  DRUG ALLERGIES:  No Known Allergies  VITALS:  Blood pressure (!) 92/55, pulse (!) 55, temperature 98.7 F (37.1 C), temperature source Oral, resp. rate 20, height 5\' 10"  (1.778 m), weight 78 kg (172 lb), SpO2 99 %.  PHYSICAL EXAMINATION:  GENERAL:  71 y.o.-year-old patient lying in the bed with no acute distress.  EYES: Pupils equal, round, reactive to light and accommodation. No scleral icterus. Extraocular muscles intact.  HEENT: Head atraumatic, normocephalic. Oropharynx and nasopharynx clear.  NECK:  Supple, no jugular venous distention. No thyroid enlargement, no tenderness.  LUNGS: Normal breath sounds bilaterally, no wheezing, rales,rhonchi or crepitation. No use of accessory muscles of respiration.  CARDIOVASCULAR: S1, S2 normal. No murmurs, rubs, or gallops.  ABDOMEN: Soft, nontender, nondistended. Bowel sounds present. No organomegaly or mass.  EXTREMITIES: No pedal edema, cyanosis, or clubbing.  NEUROLOGIC: Cranial nerves II through XII  are intact. Muscle strength 5/5 in all extremities. Sensation intact. Gait not checked.  PSYCHIATRIC: The patient is alert and oriented x 3.  SKIN: No obvious rash, lesion, or ulcer.   Physical Exam LABORATORY PANEL:   CBC  Recent Labs Lab 05/03/17 0327  WBC 11.9*  HGB 14.3  HCT 42.3  PLT 188   ------------------------------------------------------------------------------------------------------------------  Chemistries   Recent Labs Lab 05/02/17 0600 05/03/17 0327  NA 137 134*  K 3.8 3.6  CL 107 104  CO2 23 23  GLUCOSE 114* 108*  BUN 19 20  CREATININE 0.82 0.86  CALCIUM 8.8* 8.7*  MG  --  1.9  AST 275*  --   ALT 56  --   ALKPHOS 93  --   BILITOT 1.5*  --    ------------------------------------------------------------------------------------------------------------------  Cardiac Enzymes  Recent Labs Lab 05/02/17 0849 05/02/17 1532  TROPONINI >65.00* >65.00*   ------------------------------------------------------------------------------------------------------------------  RADIOLOGY:  Dg Chest Port 1 View  Result Date: 05/01/2017 CLINICAL DATA:  Acute onset of generalized chest pain. Initial encounter. EXAM: PORTABLE CHEST 1 VIEW COMPARISON:  None. FINDINGS: The lungs are well-aerated and clear. There is no evidence of focal opacification, pleural effusion or pneumothorax. The cardiomediastinal silhouette is mildly enlarged. No acute osseous abnormalities are seen. External pacing pads are noted. IMPRESSION: Mild cardiomegaly.  Lungs remain grossly clear. Electronically Signed   By: Garald Balding M.D.   On: 05/01/2017 21:25    ASSESSMENT AND PLAN:   Active Problems:   STEMI (ST elevation myocardial infarction) ([place])   * ST elevation MI   Stent placed in mid LAD.   Aspirin, Plavix, atorvastatin, lisinopril, metoprolol.   Echocardiogram Shows mild to moderately  reduced overall left ventricular function   Anterior apical septal akinesis   Ejection  fraction of around 40%   Normal right side.  * Hyperlipidemia   LDL is high, started atorvastatin.  * Hypertension   Metoprolol and lisinopril.  * Cardiomyopathy   As per cardiac catheter result he is 35-40%,    Lisinopril for now  - Cardiac rehabilitation referral    All the records are reviewed and case discussed with Care Management/Social Worker. Management plans discussed with the patient, nursingnd they are in agreement.  CODE STATUS: full.  TOTAL TIME TAKING CARE OF THIS PATIENT: 35 minutes.     POSSIBLE D/C IN 1 DAYS, DEPENDING ON CLINICAL CONDITION.   Max Sane M.D on 05/03/2017   Between 7am to 6pm - Pager - [phone]  After 6pm go to [email or web address] - password [email or web address] [facility]  Office  [phone]  CC: Primary care physician; Patient, No Pcp Per  Note: This dictation was prepared with Dragon dictation along with smaller phrase technology. Any transcriptional errors that result from this process are unintentional.

## 2017-05-03 NOTE — Consult Note (Signed)
Received referral for Cardiac Rehab Phase 2 for dx of STEMI / coronary stents.  Spoke with Mr. Stoudt about heart disease and risk factors.  Bouncing Back from Heart Attack booklet given and reviewed with patient.  Discussed the procedure and stent placement with patient.  Reviewed the vascular access site care guidelines.  Discussed modifiable risk factors.  Patient does not smoke.  Patient stated he used to exercise on a regular basis as a lifelong runner until about 10 years ago.  He stated he certainly understands the importance of exercise.  Discussed angina.  Patient stated he did not have typical crushing chest pain.  He just felt tightness in his chest with a little SOB.  Discussed the importance of calling 911 if he has these symptoms or any other chest pain in the future.  Reviewed types of medications for treating his MI and post stent - Beta Blockers, possibly Ace Inhibitor, statin meds for cholesterol, ASA and Plavix. EF on echo 40 -  45%.  EF on Cath 35 - 45%.  Informed patient Cardiologist has ordered Cardiac Rehab for patient.  Explained Cardiac Rehab program to patient.  Patient interested in Cardiac Rehab and ready to schedule a time to get started.  Patient scheduled for Cardiac Rehab Orientation on 05/10/2017 at 10:30 a.m.  Instructed patient to wear comfortable clothing and shoes to do 6 minute walk during the orientation.  Patient verbalized understanding and asked pertinent questions.     [place] Roanna Epley, RN, BSN, [facility] Cardiovascular and Pulmonary Navigator

## 2017-05-03 NOTE — Progress Notes (Signed)
Subjective:  Pt doing well he denies cp. Feels well. Denies any significant symptoms feels reasonably well. Ready to go home  Objective:  Vital Signs in the last 24 hours: Temp:  [97.9 F (36.6 C)-99.3 F (37.4 C)] 98.7 F (37.1 C) (08/06 1200) Pulse Rate:  [55-71] 55 (08/06 1200) Resp:  [16-28] 20 (08/06 1200) BP: (92-144)/(55-85) 92/55 (08/06 1200) SpO2:  [96 %-99 %] 99 % (08/06 1200)  Intake/Output from previous day: 08/05 0701 - 08/06 0700 In: 1282.9 [P.O.:587; I.V.:695.9] Out: 1350 [Urine:1350] Intake/Output from this shift: Total I/O In: 687 [P.O.:587; IV Piggyback:100] Out: 475 [Urine:475]  Physical Exam: General appearance: appears stated age Neck: no adenopathy, no carotid bruit, no JVD, supple, symmetrical, trachea midline and thyroid not enlarged, symmetric, no tenderness/mass/nodules Lungs: clear to auscultation bilaterally Heart: regular rate and rhythm, S1, S2 normal, no murmur, click, rub or gallop Abdomen: soft, non-tender; bowel sounds normal; no masses,  no organomegaly Extremities: extremities normal, atraumatic, no cyanosis or edema Pulses: 2+ and symmetric Skin: Skin color, texture, turgor normal. No rashes or lesions Neurologic: Alert and oriented X 3, normal strength and tone. Normal symmetric reflexes. Normal coordination and gait  Lab Results:  Recent Labs  05/02/17 0600 05/03/17 0327  WBC 12.8* 11.9*  HGB 15.3 14.3  PLT 211 188    Recent Labs  05/02/17 0600 05/03/17 0327  NA 137 134*  K 3.8 3.6  CL 107 104  CO2 23 23  GLUCOSE 114* 108*  BUN 19 20  CREATININE 0.82 0.86    Recent Labs  05/02/17 0849 05/02/17 1532  TROPONINI >65.00* >65.00*   Hepatic Function Panel  Recent Labs  05/02/17 0600  PROT 6.7  ALBUMIN 3.7  AST 275*  ALT 56  ALKPHOS 93  BILITOT 1.5*  BILIDIR 0.2  IBILI 1.3*    Recent Labs  05/01/17 2056  CHOL 239*   No results for input(s): PROTIME in the last 72 hours.  Imaging: Imaging results  have been reviewed  Cardiac Studies:  Assessment/Plan:  S/P STEMI ant Cardiomyopathy Chest Pain Coronary Artery Disease Coronary Artery Stent Ischemic Heart Disease MI  Hypertension Borderline diabetes Elevated troponins . PLAN S/P STEMI Ant with DES to LAD Post MI day 2 Continue plavix/ASA Agree with B-blocker/ACE  Refer to cardiac rehab OK to d/c home soon F/U cardiology 1-2 weeks    LOS: 2 days    Joseph Esparza 05/03/2017, 4:53 PM

## 2017-05-03 NOTE — Progress Notes (Signed)
Pt ambulated around nurses' station w/ no s/sx distress, VSS, although resting HR is in 50s.  HR in 60s while ambulating.  Patient currently sitting in his recliner, will inform cardiologist of resting HR

## 2017-05-03 NOTE — Progress Notes (Signed)
PULMONARY / CRITICAL CARE MEDICINE   Name: EDU ON MRN: [id number] DOB: 1946/06/29    ADMISSION DATE:  05/01/2017  CHIEF COMPLAINT:  Chest pain  HISTORY OF PRESENT ILLNESS:   This is a 71 y/o WM who presented to the ED with complaints of chest pain with EKG changes consistent with an STEMI. He was emergently taken for a LHC; found to have a 100% stenosis in the LAD. A DES was placed and patient was returned to the ICU for further management/obervation. Currently chest pain free  SUBJECTIVE:  No acute issue overnight. Troponin elevated but patient is asymptomatic  VITAL SIGNS: BP 103/75 (BP Location: Left Arm)   Pulse 60   Temp 99.3 F (37.4 C) (Oral)   Resp (!) 26   Ht 5\' 10"  (1.778 m)   Wt 172 lb (78 kg)   SpO2 99%   BMI 24.68 kg/m   HEMODYNAMICS:    VENTILATOR SETTINGS:    INTAKE / OUTPUT: I/O last 3 completed shifts: In: 1282.9 [P.O.:587; I.V.:695.9] Out: 800 [Urine:800]  PHYSICAL EXAMINATION: General: NAD HEENT:  PERRLA, trachea midline Neuro:  AAO X3, no focal eficits Cardiovascular:  RRR, S1/S2, no MRG Lungs:  CTAB Abdomen:  Non-distended, normal BS Musculoskeletal:  +ROM, no deformities Skin:  Warm and dry  LABS:  BMET  Recent Labs Lab 05/01/17 2056 05/02/17 0600 05/03/17 0327  NA 138 137 134*  K 3.9 3.8 3.6  CL 105 107 104  CO2 24 23 23   BUN 24* 19 20  CREATININE 1.04 0.82 0.86  GLUCOSE 105* 114* 108*    Electrolytes  Recent Labs Lab 05/01/17 2056 05/02/17 0600 05/03/17 0327  CALCIUM 9.6 8.8* 8.7*  MG  --   --  1.9  PHOS  --   --  2.5    CBC  Recent Labs Lab 05/01/17 2056 05/02/17 0600 05/03/17 0327  WBC 9.5 12.8* 11.9*  HGB 16.6 15.3 14.3  HCT 48.1 44.4 42.3  PLT 237 211 188    Coag's  Recent Labs Lab 05/01/17 2056  APTT 31  INR 1.06    Sepsis Markers No results for input(s): LATICACIDVEN, PROCALCITON, O2SATVEN in the last 168 hours.  ABG No results for input(s): PHART, PCO2ART, PO2ART in the last 168  hours.  Liver Enzymes  Recent Labs Lab 05/01/17 2056 05/02/17 0600  AST 26 275*  ALT 22 56  ALKPHOS 110 93  BILITOT 1.0 1.5*  ALBUMIN 4.3 3.7    Cardiac Enzymes  Recent Labs Lab 05/02/17 0311 05/02/17 0849 05/02/17 1532  TROPONINI >65.00* >65.00* >65.00*    Glucose No results for input(s): GLUCAP in the last 168 hours.  Imaging No results found.   STUDIES:  2-d echo with LV EF: 40% -   45%  LHC    A STENT XIENCE ALPINE RX [id number] drug eluting stent was successfully placed, and does not overlap previously placed stent.   Mid LAD lesion, 100 %stenosed.   Post intervention, there is a 0% residual stenosis.   There is moderate left ventricular systolic dysfunction.   LV end diastolic pressure is mildly elevated.   The left ventricular ejection fraction is 35-45% by visual estimate.   SIGNIFICANT EVENTS: 8/5>STEMI  LINES/TUBES: PIVs  DISCUSSION: 71 y/o male admitted with an STEMI; now s/p LHC with DES to LAD  ASSESSMENT  Anterior wall STEMI S/P PTCA/DES History of : Hypertension, CAD and diabetes mellitus type 2  PLAN Cardiology following Trend cardiac enzymes Monitor for recurrent chest pain Monitor and  correct electrolytes; keep K+ GE 4 and Mg ge 2 ASA, statin, plavix, ACEI and beta blocker Nitroglycerin SL Q5 mins GI and DVT prophylaxis  FAMILY  - Updates: Nop family at bedside. Will update when available  - Inter-disciplinary family meet or Palliative Care meeting due by:  day [place]. [facility] ANP-BC Pulmonary and Critical Care Medicine [facility] Pager [phone] or [phone]  05/03/2017, 5:53 AM

## 2017-05-03 NOTE — Progress Notes (Signed)
Writer paged Dr Nehemiah Massed re pt's resting HR in 50s, and drop in BP with 50 metoprolol BID, he ordered dose reduced by half.

## 2017-05-04 DIAGNOSIS — I213 ST elevation (STEMI) myocardial infarction of unspecified site: Secondary | ICD-10-CM

## 2017-05-04 LAB — HEMOGLOBIN A1C
Hgb A1c MFr Bld: 5.8 % — ABNORMAL HIGH (ref 4.8–5.6)
MEAN PLASMA GLUCOSE: 120 mg/dL

## 2017-05-04 MED ORDER — METOPROLOL TARTRATE 25 MG PO TABS: 12.5000 mg | ORAL_TABLET | Freq: Two times a day (BID) | ORAL | 3 refills | Status: DC

## 2017-05-04 MED ORDER — LISINOPRIL 5 MG PO TABS: 5.0000 mg | ORAL_TABLET | Freq: Two times a day (BID) | ORAL | 3 refills | Status: DC

## 2017-05-04 MED ORDER — ASPIRIN 81 MG PO CHEW: 81.0000 mg | CHEWABLE_TABLET | Freq: Every day | ORAL | 3 refills | Status: AC

## 2017-05-04 MED ORDER — ATORVASTATIN CALCIUM 80 MG PO TABS: 80.0000 mg | ORAL_TABLET | Freq: Every day | ORAL | 3 refills | Status: AC

## 2017-05-04 MED ORDER — CLOPIDOGREL BISULFATE 75 MG PO TABS: 75.0000 mg | ORAL_TABLET | Freq: Every day | ORAL | 3 refills | Status: DC

## 2017-05-04 NOTE — Progress Notes (Signed)
Patient discharged to home, VSS on room air. Pt instructed extensively re his new medications, orally and written, he verbalizes understanding. Prescriptions given to him. Writer made appointments for this patient with Dr Clayborn Bigness and also a PCP NP at [facility].  Patient may keep the PCP appointment, or he may change to [facility].  Writer reiterated the need for him to have a PCP, regardless of who he chooses.  Undertanding verbalized.  IV sites DCd, no bleeding.  Patient will leave hospital in car with his ex wife.

## 2017-05-04 NOTE — Discharge Summary (Signed)
Physician Discharge Summary  Patient ID: Joseph Esparza MRN: [id number] DOB/AGE: 11-12-1945 71 y.o.  Admit date: 05/01/2017 Discharge date: 05/04/2017  Admission Diagnoses:STEMI  Discharge Diagnoses:  Active Problems:   STEMI (ST elevation myocardial infarction) [facility])   Discharged Condition: stable  Hospital Course:  S/p MI Cardiology consulted STent placed Mid LAD STEMI presentation Anterior wall myocardial infarction ST elevation anteriorly reciprocal depression inferiorly Successful PCI and stent to mid LAD with DES Circumflex minute irregularities RCA no significant disease Left ventricular function is moderately depressed with anterior apical septal hypokinesis ejection fraction estimated to be around 35-40%  Consults:cardiology   Discharge Exam: Blood pressure 95/61, pulse 65, temperature 98.2 F (36.8 C), temperature source Oral, resp. rate (!) 23, height 5\' 10"  (1.778 m), weight 172 lb (78 kg), SpO2 98 %.   Physical Examination:   GENERAL:NAD, no fevers, chills, no weakness no fatigue HEAD: Normocephalic, atraumatic.  EYES: Pupils equal, round, reactive to light. Extraocular muscles intact. No scleral icterus.  MOUTH: Moist mucosal membrane. Dentition intact. No abscess noted.  EAR, NOSE, THROAT: Clear without exudates. No external lesions.  NECK: Supple. No thyromegaly. No nodules. No JVD.  PULMONARY: Diffuse coarse rhonchi right sided +wheezes ALL OTHER ROS ARE NEGATIVE     Disposition: Final discharge disposition not confirmed  Discharge Instructions    AMB Referral to Cardiac Rehabilitation - Phase II    Complete by:  As directed    Diagnosis:   STEMI Coronary Stents       ASA 81 mg daily Lipitor 80 mg daily plavix 75 mg daily Lisinopril 5 mg daily Lopressor 25 mg daily   ALl meds by mouth daily   Signed: Flora Lipps 05/04/2017, 7:20 AM

## 2017-05-10 ENCOUNTER — Encounter: Payer: Self-pay | Admitting: *Deleted

## 2017-05-10 ENCOUNTER — Encounter: Payer: Medicare Other | Attending: Internal Medicine | Admitting: *Deleted

## 2017-05-10 VITALS — Ht 68.6 in | Wt 179.8 lb

## 2017-05-10 DIAGNOSIS — Z955 Presence of coronary angioplasty implant and graft: Secondary | ICD-10-CM

## 2017-05-10 DIAGNOSIS — Z48812 Encounter for surgical aftercare following surgery on the circulatory system: Secondary | ICD-10-CM | POA: Insufficient documentation

## 2017-05-10 DIAGNOSIS — I213 ST elevation (STEMI) myocardial infarction of unspecified site: Secondary | ICD-10-CM | POA: Diagnosis present

## 2017-05-10 NOTE — Progress Notes (Signed)
Cardiac Individual Treatment Plan  Patient Details  Name: KWADWO TARAS MRN: [id number] Date of Birth: [birth year] Referring Provider:     Cardiac Rehab from 05/10/2017 in [facility] Cardiac and Pulmonary Rehab  Referring Provider  Lujean Amel MD      Initial Encounter Date:    Cardiac Rehab from 05/10/2017 in [facility] Cardiac and Pulmonary Rehab  Date  05/10/17  Referring Provider  Lujean Amel MD      Visit Diagnosis: ST elevation myocardial infarction (STEMI), unspecified artery [facility])  Status post coronary artery stent placement  Patient's Home Medications on Admission:  Current Outpatient Prescriptions:  .  aspirin 81 MG chewable tablet, Chew 1 tablet (81 mg total) by mouth daily., Disp: 30 tablet, Rfl: 3 .  atorvastatin (LIPITOR) 80 MG tablet, Take 1 tablet (80 mg total) by mouth daily at 6 PM., Disp: 30 tablet, Rfl: 3 .  clopidogrel (PLAVIX) 75 MG tablet, Take 1 tablet (75 mg total) by mouth daily with breakfast., Disp: 30 tablet, Rfl: 3 .  lisinopril (PRINIVIL,ZESTRIL) 5 MG tablet, Take 1 tablet (5 mg total) by mouth 2 (two) times daily., Disp: 30 tablet, Rfl: 3 .  metoprolol tartrate (LOPRESSOR) 25 MG tablet, Take 0.5 tablets (12.5 mg total) by mouth 2 (two) times daily., Disp: 30 tablet, Rfl: 3  Past Medical History: History reviewed. No pertinent past medical history.  Tobacco Use: History  Smoking Status  . Former Smoker  . Packs/day: 0.50  . Years: 10.00  . Types: Cigarettes  . Quit date: 09/29/1963  Smokeless Tobacco  . Never Used    Comment: Quit in his 20's  smoked maybe 10 years    Labs: Recent Review Flowsheet Data    Labs for ITP Cardiac and Pulmonary Rehab Latest Ref Rng & Units 05/01/2017 05/02/2017   Cholestrol 0 - 200 mg/dL 239(H) -   LDLCALC 0 - 99 mg/dL 175(H) -   HDL >40 mg/dL 36(L) -   Trlycerides <150 mg/dL 141 -   Hemoglobin A1c 4.8 - 5.6 % - 5.8(H)       Exercise Target Goals: Date: 05/10/17  Exercise Program Goal: Individual exercise  prescription set with THRR, safety & activity barriers. Participant demonstrates ability to understand and report RPE using BORG scale, to self-measure pulse accurately, and to acknowledge the importance of the exercise prescription.  Exercise Prescription Goal: Starting with aerobic activity 30 plus minutes a day, 3 days per week for initial exercise prescription. Provide home exercise prescription and guidelines that participant acknowledges understanding prior to discharge.  Activity Barriers & Risk Stratification:     Activity Barriers & Cardiac Risk Stratification - 05/10/17 1413      Activity Barriers & Cardiac Risk Stratification   Activity Barriers Joint Problems;Deconditioning;Balance Concerns  R knee occasionaly feels weak   Cardiac Risk Stratification High      6 Minute Walk:     6 Minute Walk    Row Name 05/10/17 1445         6 Minute Walk   Phase Initial     Distance 1690 feet     Walk Time 6 minutes     # of Rest Breaks 0     MPH 3.2     METS 3.8     RPE 15     VO2 Peak 13.31     Symptoms No     Resting HR 62 bpm     Resting BP 144/70     Max Ex. HR 111 bpm  Max Ex. BP 156/74     2 Minute Post BP 132/70        Oxygen Initial Assessment:   Oxygen Re-Evaluation:   Oxygen Discharge (Final Oxygen Re-Evaluation):   Initial Exercise Prescription:     Initial Exercise Prescription - 05/10/17 1400      Date of Initial Exercise RX and Referring Provider   Date 05/10/17   Referring Provider Lujean Amel MD     Treadmill   MPH 3.2   Grade 0.5   Minutes 15   METs 3.67     Recumbant Elliptical   Level 2   RPM 50   Minutes 15   METs 3     T5 Nustep   Level 3   SPM 100   Minutes 15   METs 3     Prescription Details   Frequency (times per week) 2   Duration Progress to 45 minutes of aerobic exercise without signs/symptoms of physical distress     Intensity   THRR 40-80% of Max Heartrate 96-130   Ratings of Perceived Exertion  11-13   Perceived Dyspnea 0-4     Progression   Progression Continue to progress workloads to maintain intensity without signs/symptoms of physical distress.     Resistance Training   Training Prescription Yes   Weight 4 lbs      Perform Capillary Blood Glucose checks as needed.  Exercise Prescription Changes:     Exercise Prescription Changes    Row Name 05/10/17 1400             Response to Exercise   Blood Pressure (Admit) 144/70       Blood Pressure (Exercise) 156/74       Blood Pressure (Exit) 132/70       Heart Rate (Admit) 62 bpm       Heart Rate (Exercise) 111 bpm       Heart Rate (Exit) 67 bpm       Oxygen Saturation (Admit) 97 %       Oxygen Saturation (Exercise) 97 %       Rating of Perceived Exertion (Exercise) 15       Symptoms none       Comments walk test results          Exercise Comments:   Exercise Goals and Review:     Exercise Goals    Row Name 05/10/17 1449             Exercise Goals   Increase Physical Activity Yes       Intervention Provide advice, education, support and counseling about physical activity/exercise needs.;Develop an individualized exercise prescription for aerobic and resistive training based on initial evaluation findings, risk stratification, comorbidities and participant's personal goals.       Expected Outcomes Achievement of increased cardiorespiratory fitness and enhanced flexibility, muscular endurance and strength shown through measurements of functional capacity and personal statement of participant.       Increase Strength and Stamina Yes       Intervention Provide advice, education, support and counseling about physical activity/exercise needs.;Develop an individualized exercise prescription for aerobic and resistive training based on initial evaluation findings, risk stratification, comorbidities and participant's personal goals.       Expected Outcomes Achievement of increased cardiorespiratory fitness and  enhanced flexibility, muscular endurance and strength shown through measurements of functional capacity and personal statement of participant.          Exercise Goals Re-Evaluation :   Discharge  Exercise Prescription (Final Exercise Prescription Changes):     Exercise Prescription Changes - 05/10/17 1400      Response to Exercise   Blood Pressure (Admit) 144/70   Blood Pressure (Exercise) 156/74   Blood Pressure (Exit) 132/70   Heart Rate (Admit) 62 bpm   Heart Rate (Exercise) 111 bpm   Heart Rate (Exit) 67 bpm   Oxygen Saturation (Admit) 97 %   Oxygen Saturation (Exercise) 97 %   Rating of Perceived Exertion (Exercise) 15   Symptoms none   Comments walk test results      Nutrition:  Target Goals: Understanding of nutrition guidelines, daily intake of sodium 1500mg , cholesterol 200mg , calories 30% from fat and 7% or less from saturated fats, daily to have 5 or more servings of fruits and vegetables.  Biometrics:     Pre Biometrics - 05/10/17 1449      Pre Biometrics   Height 5' 8.6" (1.742 m)   Weight 179 lb 12.8 oz (81.6 kg)   Waist Circumference 38 inches   Hip Circumference 39 inches   Waist to Hip Ratio 0.97 %   BMI (Calculated) 26.9   Single Leg Stand 1.35 seconds       Nutrition Therapy Plan and Nutrition Goals:     Nutrition Therapy & Goals - 05/10/17 1419      Intervention Plan   Intervention Prescribe, educate and counsel regarding individualized specific dietary modifications aiming towards targeted core components such as weight, hypertension, lipid management, diabetes, heart failure and other comorbidities.   Expected Outcomes Short Term Goal: Understand basic principles of dietary content, such as calories, fat, sodium, cholesterol and nutrients.;Short Term Goal: A plan has been developed with personal nutrition goals set during dietitian appointment.;Long Term Goal: Adherence to prescribed nutrition plan.      Nutrition Discharge: Rate Your  Plate Scores:     Nutrition Assessments - 05/10/17 1423      MEDFICTS Scores   Pre Score 27      Nutrition Goals Re-Evaluation:   Nutrition Goals Discharge (Final Nutrition Goals Re-Evaluation):   Psychosocial: Target Goals: Acknowledge presence or absence of significant depression and/or stress, maximize coping skills, provide positive support system. Participant is able to verbalize types and ability to use techniques and skills needed for reducing stress and depression.   Initial Review & Psychosocial Screening:     Initial Psych Review & Screening - 05/10/17 1429      Initial Review   Current issues with None Identified     Family Dynamics   Good Support System? Yes  Friends   Comments Spouse does not live with Lanny Hurst     Barriers   Psychosocial barriers to participate in program There are no identifiable barriers or psychosocial needs.;The patient should benefit from training in stress management and relaxation.     Screening Interventions   Interventions Encouraged to exercise;To provide support and resources with identified psychosocial needs;Provide feedback about the scores to participant      Quality of Life Scores:      Quality of Life - 05/10/17 1429      Quality of Life Scores   Health/Function Pre 24.43 %   Socioeconomic Pre 23.5 %   Psych/Spiritual Pre 21.88 %   Family Pre 23.7 %   GLOBAL Pre 23.61 %      PHQ-9: Recent Review Flowsheet Data    Depression screen [facility] 2/9 05/10/2017   Decreased Interest 0   Down, Depressed, Hopeless 0   PHQ -  2 Score 0   Altered sleeping 0   Tired, decreased energy 1   Change in appetite 0   Feeling bad or failure about yourself  0   Trouble concentrating 0   Moving slowly or fidgety/restless 0   Suicidal thoughts 0   PHQ-9 Score 1   Difficult doing work/chores Not difficult at all     Interpretation of Total Score  Total Score Depression Severity:  1-4 = Minimal depression, 5-9 = Mild depression,  10-14 = Moderate depression, 15-19 = Moderately severe depression, 20-27 = Severe depression   Psychosocial Evaluation and Intervention:   Psychosocial Re-Evaluation:   Psychosocial Discharge (Final Psychosocial Re-Evaluation):   Vocational Rehabilitation: Provide vocational rehab assistance to qualifying candidates.   Vocational Rehab Evaluation & Intervention:     Vocational Rehab - 05/10/17 1432      Initial Vocational Rehab Evaluation & Intervention   Assessment shows need for Vocational Rehabilitation No      Education: Education Goals: Education classes will be provided on a weekly basis, covering required topics. Participant will state understanding/return demonstration of topics presented.  Learning Barriers/Preferences:     Learning Barriers/Preferences - 05/10/17 1431      Learning Barriers/Preferences   Learning Barriers None   Learning Preferences None      Education Topics: General Nutrition Guidelines/Fats and Fiber: -Group instruction provided by verbal, written material, models and posters to present the general guidelines for heart healthy nutrition. Gives an explanation and review of dietary fats and fiber.   Controlling Sodium/Reading Food Labels: -Group verbal and written material supporting the discussion of sodium use in heart healthy nutrition. Review and explanation with models, verbal and written materials for utilization of the food label.   Exercise Physiology & Risk Factors: - Group verbal and written instruction with models to review the exercise physiology of the cardiovascular system and associated critical values. Details cardiovascular disease risk factors and the goals associated with each risk factor.   Aerobic Exercise & Resistance Training: - Gives group verbal and written discussion on the health impact of inactivity. On the components of aerobic and resistive training programs and the benefits of this training and how to  safely progress through these programs.   Flexibility, Balance, General Exercise Guidelines: - Provides group verbal and written instruction on the benefits of flexibility and balance training programs. Provides general exercise guidelines with specific guidelines to those with heart or lung disease. Demonstration and skill practice provided.   Stress Management: - Provides group verbal and written instruction about the health risks of elevated stress, cause of high stress, and healthy ways to reduce stress.   Depression: - Provides group verbal and written instruction on the correlation between heart/lung disease and depressed mood, treatment options, and the stigmas associated with seeking treatment.   Anatomy & Physiology of the Heart: - Group verbal and written instruction and models provide basic cardiac anatomy and physiology, with the coronary electrical and arterial systems. Review of: AMI, Angina, Valve disease, Heart Failure, Cardiac Arrhythmia, Pacemakers, and the ICD.   Cardiac Procedures: - Group verbal and written instruction and models to describe the testing methods done to diagnose heart disease. Reviews the outcomes of the test results. Describes the treatment choices: Medical Management, Angioplasty, or Coronary Bypass Surgery.   Cardiac Medications: - Group verbal and written instruction to review commonly prescribed medications for heart disease. Reviews the medication, class of the drug, and side effects. Includes the steps to properly store meds and maintain the prescription  regimen.   Go Sex-Intimacy & Heart Disease, Get SMART - Goal Setting: - Group verbal and written instruction through game format to discuss heart disease and the return to sexual intimacy. Provides group verbal and written material to discuss and apply goal setting through the application of the S.M.A.R.T. Method.   Other Matters of the Heart: - Provides group verbal, written materials and  models to describe Heart Failure, Angina, Valve Disease, and Diabetes in the realm of heart disease. Includes description of the disease process and treatment options available to the cardiac patient.   Exercise & Equipment Safety: - Individual verbal instruction and demonstration of equipment use and safety with use of the equipment.   Cardiac Rehab from 05/10/2017 in [facility] Cardiac and Pulmonary Rehab  Date  05/10/17  Educator  Sb  Instruction Review Code  2- meets goals/outcomes      Infection Prevention: - Provides verbal and written material to individual with discussion of infection control including proper hand washing and proper equipment cleaning during exercise session.   Cardiac Rehab from 05/10/2017 in [facility] Cardiac and Pulmonary Rehab  Date  05/10/17  Educator  SB  Instruction Review Code  2- meets goals/outcomes      Falls Prevention: - Provides verbal and written material to individual with discussion of falls prevention and safety.   Cardiac Rehab from 05/10/2017 in [facility] Cardiac and Pulmonary Rehab  Date  05/10/17  Educator  Sb  Instruction Review Code  2- meets goals/outcomes      Diabetes: - Individual verbal and written instruction to review signs/symptoms of diabetes, desired ranges of glucose level fasting, after meals and with exercise. Advice that pre and post exercise glucose checks will be done for 3 sessions at entry of program.    Knowledge Questionnaire Score:     Knowledge Questionnaire Score - 05/10/17 1431      Knowledge Questionnaire Score   Pre Score 27/28  Reviewed correct response with Lanny Hurst. He verbalized understanding of correct response.      Core Components/Risk Factors/Patient Goals at Admission:     Personal Goals and Risk Factors at Admission - 05/10/17 1427      Core Components/Risk Factors/Patient Goals on Admission    Weight Management Yes;Weight Loss   Intervention Weight Management: Develop a combined nutrition and exercise  program designed to reach desired caloric intake, while maintaining appropriate intake of nutrient and fiber, sodium and fats, and appropriate energy expenditure required for the weight goal.;Weight Management/Obesity: Establish reasonable short term and long term weight goals.;Obesity: Provide education and appropriate resources to help participant work on and attain dietary goals.;Weight Management: Provide education and appropriate resources to help participant work on and attain dietary goals.  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   Admit Weight 179 lb 12.8 oz (81.6 kg)   Goal Weight: Short Term 177 lb (80.3 kg)   Goal Weight: Long Term 155 lb (70.3 kg)   Expected Outcomes Long Term: Adherence to nutrition and physical activity/exercise program aimed toward attainment of established weight goal;Short Term: Continue to assess and modify interventions until short term weight is achieved;Weight Loss: Understanding of general recommendations for a balanced deficit meal plan, which promotes 1-2 lb weight loss per week and includes a negative energy balance of [phone] kcal/d   Hypertension Yes   Intervention Provide education on lifestyle modifcations including regular physical activity/exercise, weight management, moderate sodium restriction and increased consumption of fresh fruit, vegetables, and  low fat dairy, alcohol moderation, and smoking cessation.;Monitor prescription use compliance.   Expected Outcomes Short Term: Continued assessment and intervention until BP is < 140/5mm HG in hypertensive participants. < 130/22mm HG in hypertensive participants with diabetes, heart failure or chronic kidney disease.;Long Term: Maintenance of blood pressure at goal levels.   Lipids Yes   Intervention Provide education and support for participant on nutrition & aerobic/resistive exercise along with prescribed medications to achieve LDL 70mg , HDL >40mg .   Expected  Outcomes Short Term: Participant states understanding of desired cholesterol values and is compliant with medications prescribed. Participant is following exercise prescription and nutrition guidelines.;Long Term: Cholesterol controlled with medications as prescribed, with individualized exercise RX and with personalized nutrition plan. Value goals: LDL < 70mg , HDL > 40 mg.      Core Components/Risk Factors/Patient Goals Review:    Core Components/Risk Factors/Patient Goals at Discharge (Final Review):    ITP Comments:     ITP Comments    Row Name 05/10/17 1409           ITP Comments Medical review completed today. Initial ITP created fro review, changes as needed and cosign by Dr Loleta Chance.  Documentation of diagnosis can be found in [facility] 05/01/2017 Admission          Comments: Initial ITP

## 2017-05-10 NOTE — Patient Instructions (Signed)
Patient Instructions  Patient Details  Name: Joseph Esparza MRN: [id number] Date of Birth: 10/30/1945 Referring Provider:  Yolonda Kida, MD  Below are the personal goals you chose as well as exercise and nutrition goals. Our goal is to help you keep on track towards obtaining and maintaining your goals. We will be discussing your progress on these goals with you throughout the program.  Initial Exercise Prescription:     Initial Exercise Prescription - 05/10/17 1400      Date of Initial Exercise RX and Referring Provider   Date 05/10/17   Referring Provider Lujean Amel MD     Treadmill   MPH 3.2   Grade 0.5   Minutes 15   METs 3.67     Recumbant Elliptical   Level 2   RPM 50   Minutes 15   METs 3     T5 Nustep   Level 3   SPM 100   Minutes 15   METs 3     Prescription Details   Frequency (times per week) 2   Duration Progress to 45 minutes of aerobic exercise without signs/symptoms of physical distress     Intensity   THRR 40-80% of Max Heartrate 96-130   Ratings of Perceived Exertion 11-13   Perceived Dyspnea 0-4     Progression   Progression Continue to progress workloads to maintain intensity without signs/symptoms of physical distress.     Resistance Training   Training Prescription Yes   Weight 4 lbs      Exercise Goals: Frequency: Be able to perform aerobic exercise three times per week working toward 3-5 days per week.  Intensity: Work with a perceived exertion of 11 (fairly light) - 15 (hard) as tolerated. Follow your new exercise prescription and watch for changes in prescription as you progress with the program. Changes will be reviewed with you when they are made.  Duration: You should be able to do 30 minutes of continuous aerobic exercise in addition to a 5 minute warm-up and a 5 minute cool-down routine.  Nutrition Goals: Your personal nutrition goals will be established when you do your nutrition analysis with the dietician.  The  following are nutrition guidelines to follow: Cholesterol < 200mg /day Sodium < 1500mg /day Fiber: Men over 50 yrs - 30 grams per day  Personal Goals:     Personal Goals and Risk Factors at Admission - 05/10/17 1427      Core Components/Risk Factors/Patient Goals on Admission    Weight Management Yes;Weight Loss   Intervention Weight Management: Develop a combined nutrition and exercise program designed to reach desired caloric intake, while maintaining appropriate intake of nutrient and fiber, sodium and fats, and appropriate energy expenditure required for the weight goal.;Weight Management/Obesity: Establish reasonable short term and long term weight goals.;Obesity: Provide education and appropriate resources to help participant work on and attain dietary goals.;Weight Management: Provide education and appropriate resources to help participant work on and attain dietary goals.  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   Admit Weight 179 lb 12.8 oz (81.6 kg)   Goal Weight: Short Term 177 lb (80.3 kg)   Goal Weight: Long Term 155 lb (70.3 kg)   Expected Outcomes Long Term: Adherence to nutrition and physical activity/exercise program aimed toward attainment of established weight goal;Short Term: Continue to assess and modify interventions until short term weight is achieved;Weight Loss: Understanding of general recommendations for a balanced deficit meal plan,  which promotes 1-2 lb weight loss per week and includes a negative energy balance of [phone] kcal/d   Hypertension Yes   Intervention Provide education on lifestyle modifcations including regular physical activity/exercise, weight management, moderate sodium restriction and increased consumption of fresh fruit, vegetables, and low fat dairy, alcohol moderation, and smoking cessation.;Monitor prescription use compliance.   Expected Outcomes Short Term: Continued assessment and intervention until BP is  < 140/79mm HG in hypertensive participants. < 130/25mm HG in hypertensive participants with diabetes, heart failure or chronic kidney disease.;Long Term: Maintenance of blood pressure at goal levels.   Lipids Yes   Intervention Provide education and support for participant on nutrition & aerobic/resistive exercise along with prescribed medications to achieve LDL 70mg , HDL >40mg .   Expected Outcomes Short Term: Participant states understanding of desired cholesterol values and is compliant with medications prescribed. Participant is following exercise prescription and nutrition guidelines.;Long Term: Cholesterol controlled with medications as prescribed, with individualized exercise RX and with personalized nutrition plan. Value goals: LDL < 70mg , HDL > 40 mg.      Tobacco Use Initial Evaluation: History  Smoking Status  . Former Smoker  . Packs/day: 0.50  . Years: 10.00  . Types: Cigarettes  . Quit date: 09/29/1963  Smokeless Tobacco  . Never Used    Comment: Quit in his 20's  smoked maybe 10 years    Copy of goals given to participant.

## 2017-05-10 NOTE — Progress Notes (Signed)
Daily Session Note  Patient Details  Name: Joseph Esparza MRN: [id number] Date of Birth: May 12, 1946 Referring Provider:    Encounter Date: 05/10/2017  Check In:     Session Check In - 05/10/17 1409      Check-In   Location [facility]Card[facility] Pul[facility] Re[facility]   Staff Present Heath Lark, RN, BSN, CCRP;Carroll Enterkin, RN, Levie Heritage, MA, ACSM RCEP, Exercise Physiologist   Supervising physician immediately available to respond to emergencies See telemetry face sheet for immediately available ER MD   Medication changes reported     No   Fall or balance concerns reported    No   Warm-up and Cool-down Performed as group-led instruction   Resistance Training Performed Yes   VAD Patient? No     Pain Assessment   Currently in Pain? No/denies           Exercise Prescription Changes - 05/10/17 1400      Response to Exercise   Blood Pressure (Admit) 144/70   Blood Pressure (Exercise) 156/74   Blood Pressure (Exit) 132/70   Heart Rate (Admit) 62 bpm   Heart Rate (Exercise) 111 bpm   Heart Rate (Exit) 67 bpm   Oxygen Saturation (Admit) 97 %   Oxygen Saturation (Exercise) 97 %   Rating of Perceived Exertion (Exercise) 15      History  Smoking Status  . Former Smoker  . Packs/day: 0.50  . Years: 10.00  . Types: Cigarettes  . Quit date: 09/29/1963  Smokeless Tobacco  . Never Used    Comment: Quit in his 20's  smoked maybe 10 years    Goals Met:  Exercise tolerated well Personal goals reviewed No report of cardiac concerns or symptoms Strength training completed today  Goals Unmet:  Not Applicable  Comments: medical review completed today   Dr. Emily Filbert is Medical Director for [place] and [facility].

## 2017-05-13 DIAGNOSIS — Z955 Presence of coronary angioplasty implant and graft: Secondary | ICD-10-CM

## 2017-05-13 DIAGNOSIS — I213 ST elevation (STEMI) myocardial infarction of unspecified site: Secondary | ICD-10-CM

## 2017-05-13 DIAGNOSIS — Z48812 Encounter for surgical aftercare following surgery on the circulatory system: Secondary | ICD-10-CM | POA: Diagnosis not present

## 2017-05-13 NOTE — Progress Notes (Signed)
Daily Session Note  Patient Details  Name: Joseph Esparza MRN: [id number] Date of Birth: 1946-05-03 Referring Provider:     Cardiac Rehab from 05/10/2017 in [facility] and Pulmonary Rehab  Referring Provider  Lujean Amel MD      Encounter Date: 05/13/2017  Check In:     Session Check In - 05/13/17 0906      Check-In   Location [facility]   Staff Present Alberteen Sam, MA, ACSM RCEP, Exercise Physiologist;Zymere Patlan Oletta Darter, BA, ACSM CEP, Exercise Physiologist;Meredith Sherryll Burger, RN BSN   Supervising physician immediately available to respond to emergencies See telemetry face sheet for immediately available ER MD   Medication changes reported     No   Fall or balance concerns reported    No   Warm-up and Cool-down Performed on first and last piece of equipment   Resistance Training Performed Yes   VAD Patient? No     Pain Assessment   Currently in Pain? No/denies         History  Smoking Status  . Former Smoker  . Packs/day: 0.50  . Years: 10.00  . Types: Cigarettes  . Quit date: 09/29/1963  Smokeless Tobacco  . Never Used    Comment: Quit in his 20's  smoked maybe 10 years    Goals Met:  Independence with exercise equipment Exercise tolerated well  Goals Unmet:  Not Applicable  Comments: First full day of exercise!  Patient was oriented to gym and equipment including functions, settings, policies, and procedures.  Patient's individual exercise prescription and treatment plan were reviewed.  All starting workloads were established based on the results of the 6 minute walk test done at initial orientation visit.  The plan for exercise progression was also introduced and progression will be customized based on patient's performance and goals.    Dr. Emily Filbert is Medical Director for [place] and [facility].

## 2017-05-18 DIAGNOSIS — Z955 Presence of coronary angioplasty implant and graft: Secondary | ICD-10-CM

## 2017-05-18 DIAGNOSIS — Z48812 Encounter for surgical aftercare following surgery on the circulatory system: Secondary | ICD-10-CM | POA: Diagnosis not present

## 2017-05-18 DIAGNOSIS — I213 ST elevation (STEMI) myocardial infarction of unspecified site: Secondary | ICD-10-CM

## 2017-05-18 NOTE — Progress Notes (Signed)
Daily Session Note  Patient Details  Name: Joseph Esparza MRN: [id number] Date of Birth: [birth year] Referring Provider:     Cardiac Rehab from 05/10/2017 in [facility] Cardiac and Pulmonary Rehab  Referring Provider  Lujean Amel MD      Encounter Date: 05/18/2017  Check In:     Session Check In - 05/18/17 0826      Check-In   Location [facility]   Staff Present Heath Lark, RN, BSN, CCRP;Jessica Luan Pulling, MA, ACSM RCEP, Exercise Physiologist;Amanda Oletta Darter, BA, ACSM CEP, Exercise Physiologist   Supervising physician immediately available to respond to emergencies See telemetry face sheet for immediately available ER MD   Medication changes reported     No   Fall or balance concerns reported    No   Warm-up and Cool-down Performed on first and last piece of equipment   Resistance Training Performed Yes   VAD Patient? No     Pain Assessment   Currently in Pain? No/denies         History  Smoking Status  . Former Smoker  . Packs/day: 0.50  . Years: 10.00  . Types: Cigarettes  . Quit date: 09/29/1963  Smokeless Tobacco  . Never Used    Comment: Quit in his 20's  smoked maybe 10 years    Goals Met:  Proper associated with RPD/PD & O2 Sat Independence with exercise equipment Exercise tolerated well Strength training completed today  Goals Unmet:  Not Applicable  Comments: Pt able to follow exercise prescription today without complaint.  Will continue to monitor for progression.    Dr. Emily Filbert is Medical Director for [place] and [facility].

## 2017-05-20 ENCOUNTER — Encounter: Payer: Medicare Other | Admitting: *Deleted

## 2017-05-20 DIAGNOSIS — I213 ST elevation (STEMI) myocardial infarction of unspecified site: Secondary | ICD-10-CM

## 2017-05-20 DIAGNOSIS — Z955 Presence of coronary angioplasty implant and graft: Secondary | ICD-10-CM

## 2017-05-20 DIAGNOSIS — Z48812 Encounter for surgical aftercare following surgery on the circulatory system: Secondary | ICD-10-CM | POA: Diagnosis not present

## 2017-05-20 NOTE — Progress Notes (Signed)
Daily Session Note  Patient Details  Name: Joseph Esparza MRN: [id number] Date of Birth: [birth year] Referring Provider:     Cardiac Rehab from 05/10/2017 in [facility] Cardiac and Pulmonary Rehab  Referring Provider  Lujean Amel MD      Encounter Date: 05/20/2017  Check In:     Session Check In - 05/20/17 0927      Check-In   Location [facility]   Staff Present Alberteen Sam, MA, ACSM RCEP, Exercise Physiologist;Amanda Oletta Darter, BA, ACSM CEP, Exercise Physiologist;Meredith Sherryll Burger, RN BSN   Supervising physician immediately available to respond to emergencies See telemetry face sheet for immediately available ER MD   Medication changes reported     No   Fall or balance concerns reported    No   Warm-up and Cool-down Performed on first and last piece of equipment   Resistance Training Performed Yes   VAD Patient? No     Pain Assessment   Currently in Pain? No/denies   Multiple Pain Sites No           Exercise Prescription Changes - 05/19/17 1400      Response to Exercise   Blood Pressure (Admit) 100/60   Blood Pressure (Exercise) 134/64   Blood Pressure (Exit) 124/70   Heart Rate (Admit) 63 bpm   Heart Rate (Exercise) 106 bpm   Heart Rate (Exit) 77 bpm   Rating of Perceived Exertion (Exercise) 13   Symptoms none   Comments second full day of exercise   Duration Progress to 45 minutes of aerobic exercise without signs/symptoms of physical distress   Intensity THRR unchanged     Progression   Progression Continue to progress workloads to maintain intensity without signs/symptoms of physical distress.   Average METs 2.62     Resistance Training   Training Prescription Yes   Weight 3 lbs   Reps 10-15     Interval Training   Interval Training No     Treadmill   MPH 3.2   Grade 0.5   Minutes 15   METs 3.67     Recumbant Elliptical   Level 2   Minutes 15   METs 1.9     T5 Nustep   Level 3   Minutes 15   METs 2.3      History   Smoking Status  . Former Smoker  . Packs/day: 0.50  . Years: 10.00  . Types: Cigarettes  . Quit date: 09/29/1963  Smokeless Tobacco  . Never Used    Comment: Quit in his 20's  smoked maybe 10 years    Goals Met:  Independence with exercise equipment Exercise tolerated well No report of cardiac concerns or symptoms Strength training completed today  Goals Unmet:  Not Applicable  Comments: Pt able to follow exercise prescription today without complaint.  Will continue to monitor for progression.    Dr. Emily Filbert is Medical Director for [place] and [facility].

## 2017-05-25 ENCOUNTER — Encounter: Payer: Medicare Other | Admitting: *Deleted

## 2017-05-25 DIAGNOSIS — Z955 Presence of coronary angioplasty implant and graft: Secondary | ICD-10-CM

## 2017-05-25 DIAGNOSIS — I213 ST elevation (STEMI) myocardial infarction of unspecified site: Secondary | ICD-10-CM

## 2017-05-25 DIAGNOSIS — Z48812 Encounter for surgical aftercare following surgery on the circulatory system: Secondary | ICD-10-CM | POA: Diagnosis not present

## 2017-05-25 NOTE — Progress Notes (Signed)
Daily Session Note  Patient Details  Name: JUWAN VENCES MRN: [id number] Date of Birth: [birth year] Referring Provider:     Cardiac Rehab from 05/10/2017 in [facility] Cardiac and Pulmonary Rehab  Referring Provider  Lujean Amel MD      Encounter Date: 05/25/2017  Check In:     Session Check In - 05/25/17 0845      Check-In   Location [facility]   Staff Present Heath Lark, RN, BSN, CCRP;Jessica Luan Pulling, MA, ACSM RCEP, Exercise Physiologist;Amanda Oletta Darter, BA, ACSM CEP, Exercise Physiologist;Venissa Nappi Flavia Shipper   Supervising physician immediately available to respond to emergencies See telemetry face sheet for immediately available ER MD   Medication changes reported     No   Fall or balance concerns reported    No   Warm-up and Cool-down Performed on first and last piece of equipment   Resistance Training Performed Yes   VAD Patient? No     Pain Assessment   Currently in Pain? No/denies   Multiple Pain Sites No         History  Smoking Status  . Former Smoker  . Packs/day: 0.50  . Years: 10.00  . Types: Cigarettes  . Quit date: 09/29/1963  Smokeless Tobacco  . Never Used    Comment: Quit in his 20's  smoked maybe 10 years    Goals Met:  Independence with exercise equipment Exercise tolerated well Personal goals reviewed No report of cardiac concerns or symptoms Strength training completed today  Goals Unmet:  Not Applicable  Comments: Pt able to follow exercise prescription today without complaint.  Will continue to monitor for progression.   Dr. Emily Filbert is Medical Director for [place] and [facility].

## 2017-05-27 DIAGNOSIS — I213 ST elevation (STEMI) myocardial infarction of unspecified site: Secondary | ICD-10-CM

## 2017-05-27 DIAGNOSIS — Z955 Presence of coronary angioplasty implant and graft: Secondary | ICD-10-CM

## 2017-05-27 DIAGNOSIS — Z48812 Encounter for surgical aftercare following surgery on the circulatory system: Secondary | ICD-10-CM | POA: Diagnosis not present

## 2017-05-27 NOTE — Progress Notes (Signed)
Daily Session Note  Patient Details  Name: Joseph Esparza MRN: [id number] Date of Birth: Aug 03, 1946 Referring Provider:     Cardiac Rehab from 05/10/2017 in [facility] Cardiac and Pulmonary Rehab  Referring Provider  Lujean Amel MD      Encounter Date: 05/27/2017  Check In:     Session Check In - 05/27/17 0914      Check-In   Location [facility]   Staff Present Alberteen Sam, MA, ACSM RCEP, Exercise Physiologist;Amanda Oletta Darter, BA, ACSM CEP, Exercise Physiologist;Krista Frederico Hamman, RN BSN   Supervising physician immediately available to respond to emergencies See telemetry face sheet for immediately available ER MD   Medication changes reported     No   Fall or balance concerns reported    No   Warm-up and Cool-down Performed on first and last piece of equipment   Resistance Training Performed Yes   VAD Patient? No     Pain Assessment   Currently in Pain? No/denies         History  Smoking Status  . Former Smoker  . Packs/day: 0.50  . Years: 10.00  . Types: Cigarettes  . Quit date: 09/29/1963  Smokeless Tobacco  . Never Used    Comment: Quit in his 20's  smoked maybe 10 years    Goals Met:  Independence with exercise equipment Exercise tolerated well No report of cardiac concerns or symptoms Strength training completed today  Goals Unmet:  Not Applicable  Comments: Reviewed RPE scale, THR and program prescription with pt today.  Pt voiced understanding and was given a copy of goals to take home.   Short: Use RPE daily to regulate intensity.  Long: Follow program prescription in THR.    Dr. Emily Filbert is Medical Director for [place] and [facility].

## 2017-06-01 ENCOUNTER — Encounter: Payer: Medicare Other | Attending: Internal Medicine

## 2017-06-01 DIAGNOSIS — I213 ST elevation (STEMI) myocardial infarction of unspecified site: Secondary | ICD-10-CM | POA: Insufficient documentation

## 2017-06-01 DIAGNOSIS — Z48812 Encounter for surgical aftercare following surgery on the circulatory system: Secondary | ICD-10-CM | POA: Diagnosis present

## 2017-06-01 DIAGNOSIS — Z955 Presence of coronary angioplasty implant and graft: Secondary | ICD-10-CM | POA: Insufficient documentation

## 2017-06-01 NOTE — Progress Notes (Signed)
Daily Session Note  Patient Details  Name: Joseph Esparza MRN: [id number] Date of Birth: 11-Aug-1946 Referring Provider:     Cardiac Rehab from 05/10/2017 in [facility] Cardiac and Pulmonary Rehab  Referring Provider  Lujean Amel MD      Encounter Date: 06/01/2017  Check In:     Session Check In - 06/01/17 0900      Check-In   Location [facility]   Staff Present Alberteen Sam, MA, ACSM RCEP, Exercise Physiologist;Amanda Oletta Darter, BA, ACSM CEP, Exercise Physiologist;Susanne Bice, RN, BSN, CCRP   Supervising physician immediately available to respond to emergencies See telemetry face sheet for immediately available ER MD   Medication changes reported     No   Fall or balance concerns reported    No   Warm-up and Cool-down Performed on first and last piece of equipment   Resistance Training Performed Yes   VAD Patient? No     Pain Assessment   Currently in Pain? No/denies         History  Smoking Status  . Former Smoker  . Packs/day: 0.50  . Years: 10.00  . Types: Cigarettes  . Quit date: 09/29/1963  Smokeless Tobacco  . Never Used    Comment: Quit in his 20's  smoked maybe 10 years    Goals Met:  Independence with exercise equipment Exercise tolerated well No report of cardiac concerns or symptoms Strength training completed today  Goals Unmet:  Not Applicable  Comments: Reviewed home exercise with pt today.  Pt plans to walk for exercise.  Reviewed THR, pulse, RPE, sign and symptoms, NTG use, and when to call 911 or MD.  Also discussed weather considerations and indoor options.  Pt voiced understanding.    Dr. Emily Filbert is Medical Director for [place] and [facility].

## 2017-06-03 DIAGNOSIS — Z955 Presence of coronary angioplasty implant and graft: Secondary | ICD-10-CM

## 2017-06-03 DIAGNOSIS — Z48812 Encounter for surgical aftercare following surgery on the circulatory system: Secondary | ICD-10-CM | POA: Diagnosis not present

## 2017-06-03 DIAGNOSIS — I213 ST elevation (STEMI) myocardial infarction of unspecified site: Secondary | ICD-10-CM

## 2017-06-03 NOTE — Progress Notes (Signed)
Daily Session Note  Patient Details  Name: Joseph Esparza MRN: [id number] Date of Birth: [birth year] Referring Provider:     Cardiac Rehab from 05/10/2017 in [facility] Cardiac and Pulmonary Rehab  Referring Provider  Lujean Amel MD      Encounter Date: 06/03/2017  Check In:     Session Check In - 06/03/17 0830      Check-In   Location [facility]   Staff Present Alberteen Sam, MA, ACSM RCEP, Exercise Physiologist;Tunis Gentle Oletta Darter, BA, ACSM CEP, Exercise Physiologist;Meredith Sherryll Burger, RN BSN   Supervising physician immediately available to respond to emergencies See telemetry face sheet for immediately available ER MD   Medication changes reported     No   Fall or balance concerns reported    No   Warm-up and Cool-down Performed on first and last piece of equipment   Resistance Training Performed Yes   VAD Patient? No     Pain Assessment   Currently in Pain? No/denies         History  Smoking Status  . Former Smoker  . Packs/day: 0.50  . Years: 10.00  . Types: Cigarettes  . Quit date: 09/29/1963  Smokeless Tobacco  . Never Used    Comment: Quit in his 20's  smoked maybe 10 years    Goals Met:  Independence with exercise equipment Exercise tolerated well No report of cardiac concerns or symptoms Strength training completed today  Goals Unmet:  Not Applicable  Comments: Reviewed RPE scale, THR and program prescription with pt today.  Pt voiced understanding and was given a copy of goals to take home.   Short: Use RPE daily to regulate intensity.  Long: Follow program prescription in THR.    Dr. Emily Filbert is Medical Director for [place] and [facility].

## 2017-06-08 DIAGNOSIS — Z955 Presence of coronary angioplasty implant and graft: Secondary | ICD-10-CM

## 2017-06-08 DIAGNOSIS — I213 ST elevation (STEMI) myocardial infarction of unspecified site: Secondary | ICD-10-CM

## 2017-06-08 DIAGNOSIS — Z48812 Encounter for surgical aftercare following surgery on the circulatory system: Secondary | ICD-10-CM | POA: Diagnosis not present

## 2017-06-08 NOTE — Progress Notes (Signed)
Daily Session Note  Patient Details  Name: Joseph Esparza MRN: [id number] Date of Birth: 1946-07-20 Referring Provider:     Cardiac Rehab from 05/10/2017 in [facility] Cardiac and Pulmonary Rehab  Referring Provider  Lujean Amel MD      Encounter Date: 06/08/2017  Check In:     Session Check In - 06/08/17 0840      Check-In   Location [facility]   Staff Present Alberteen Sam, MA, ACSM RCEP, Exercise Physiologist;Amanda Oletta Darter, BA, ACSM CEP, Exercise Physiologist;Susanne Bice, RN, BSN, CCRP   Supervising physician immediately available to respond to emergencies See telemetry face sheet for immediately available ER MD   Medication changes reported     No   Fall or balance concerns reported    No   Warm-up and Cool-down Performed on first and last piece of equipment   Resistance Training Performed Yes   VAD Patient? No     Pain Assessment   Currently in Pain? No/denies         History  Smoking Status  . Former Smoker  . Packs/day: 0.50  . Years: 10.00  . Types: Cigarettes  . Quit date: 09/29/1963  Smokeless Tobacco  . Never Used    Comment: Quit in his 20's  smoked maybe 10 years    Goals Met:  Independence with exercise equipment Exercise tolerated well No report of cardiac concerns or symptoms Strength training completed today  Goals Unmet:  Not Applicable  Comments: Pt able to follow exercise prescription today without complaint.  Will continue to monitor for progression.    Dr. Emily Filbert is Medical Director for [place] and [facility].

## 2017-06-09 ENCOUNTER — Encounter: Payer: Self-pay | Admitting: *Deleted

## 2017-06-09 DIAGNOSIS — I213 ST elevation (STEMI) myocardial infarction of unspecified site: Secondary | ICD-10-CM

## 2017-06-09 NOTE — Progress Notes (Signed)
Cardiac Individual Treatment Plan  Patient Details  Name: Joseph Esparza MRN: [id number] Date of Birth: 1946/02/29 Referring Provider:     Cardiac Rehab from 05/10/2017 in [facility] Cardiac and Pulmonary Rehab  Referring Provider  Lujean Amel MD      Initial Encounter Date:    Cardiac Rehab from 05/10/2017 in [facility] Cardiac and Pulmonary Rehab  Date  05/10/17  Referring Provider  Lujean Amel MD      Visit Diagnosis: ST elevation myocardial infarction (STEMI), unspecified artery ([place])  Patient's Home Medications on Admission:  Current Outpatient Prescriptions:  .  aspirin 81 MG chewable tablet, Chew 1 tablet (81 mg total) by mouth daily., Disp: 30 tablet, Rfl: 3 .  atorvastatin (LIPITOR) 80 MG tablet, Take 1 tablet (80 mg total) by mouth daily at 6 PM., Disp: 30 tablet, Rfl: 3 .  clopidogrel (PLAVIX) 75 MG tablet, Take 1 tablet (75 mg total) by mouth daily with breakfast., Disp: 30 tablet, Rfl: 3 .  lisinopril (PRINIVIL,ZESTRIL) 5 MG tablet, Take 1 tablet (5 mg total) by mouth 2 (two) times daily., Disp: 30 tablet, Rfl: 3 .  metoprolol tartrate (LOPRESSOR) 25 MG tablet, Take 0.5 tablets (12.5 mg total) by mouth 2 (two) times daily., Disp: 30 tablet, Rfl: 3  Past Medical History: No past medical history on file.  Tobacco Use: History  Smoking Status  . Former Smoker  . Packs/day: 0.50  . Years: 10.00  . Types: Cigarettes  . Quit date: 09/29/1963  Smokeless Tobacco  . Never Used    Comment: Quit in his 20's  smoked maybe 10 years    Labs: Recent Review Flowsheet Data    Labs for ITP Cardiac and Pulmonary Rehab Latest Ref Rng & Units 05/01/2017 05/02/2017   Cholestrol 0 - 200 mg/dL 239(H) -   LDLCALC 0 - 99 mg/dL 175(H) -   HDL >40 mg/dL 36(L) -   Trlycerides <150 mg/dL 141 -   Hemoglobin A1c 4.8 - 5.6 % - 5.8(H)       Exercise Target Goals:    Exercise Program Goal: Individual exercise prescription set with THRR, safety & activity barriers. Participant demonstrates  ability to understand and report RPE using BORG scale, to self-measure pulse accurately, and to acknowledge the importance of the exercise prescription.  Exercise Prescription Goal: Starting with aerobic activity 30 plus minutes a day, 3 days per week for initial exercise prescription. Provide home exercise prescription and guidelines that participant acknowledges understanding prior to discharge.  Activity Barriers & Risk Stratification:     Activity Barriers & Cardiac Risk Stratification - 05/10/17 1413      Activity Barriers & Cardiac Risk Stratification   Activity Barriers Joint Problems;Deconditioning;Balance Concerns  R knee occasionaly feels weak   Cardiac Risk Stratification High      6 Minute Walk:     6 Minute Walk    Row Name 05/10/17 1445         6 Minute Walk   Phase Initial     Distance 1690 feet     Walk Time 6 minutes     # of Rest Breaks 0     MPH 3.2     METS 3.8     RPE 15     VO2 Peak 13.31     Symptoms No     Resting HR 62 bpm     Resting BP 144/70     Max Ex. HR 111 bpm     Max Ex. BP 156/74  2 Minute Post BP 132/70        Oxygen Initial Assessment:   Oxygen Re-Evaluation:   Oxygen Discharge (Final Oxygen Re-Evaluation):   Initial Exercise Prescription:     Initial Exercise Prescription - 05/10/17 1400      Date of Initial Exercise RX and Referring Provider   Date 05/10/17   Referring Provider Lujean Amel MD     Treadmill   MPH 3.2   Grade 0.5   Minutes 15   METs 3.67     Recumbant Elliptical   Level 2   RPM 50   Minutes 15   METs 3     T5 Nustep   Level 3   SPM 100   Minutes 15   METs 3     Prescription Details   Frequency (times per week) 2   Duration Progress to 45 minutes of aerobic exercise without signs/symptoms of physical distress     Intensity   THRR 40-80% of Max Heartrate 96-130   Ratings of Perceived Exertion 11-13   Perceived Dyspnea 0-4     Progression   Progression Continue to  progress workloads to maintain intensity without signs/symptoms of physical distress.     Resistance Training   Training Prescription Yes   Weight 4 lbs      Perform Capillary Blood Glucose checks as needed.  Exercise Prescription Changes:     Exercise Prescription Changes    Row Name 05/10/17 1400 05/19/17 1400 06/01/17 1500         Response to Exercise   Blood Pressure (Admit) 144/70 100/60 104/64     Blood Pressure (Exercise) 156/74 134/64 148/66     Blood Pressure (Exit) 132/70 124/70 102/60     Heart Rate (Admit) 62 bpm 63 bpm 76 bpm     Heart Rate (Exercise) 111 bpm 106 bpm 119 bpm     Heart Rate (Exit) 67 bpm 77 bpm 63 bpm     Oxygen Saturation (Admit) 97 %  -  -     Oxygen Saturation (Exercise) 97 %  -  -     Rating of Perceived Exertion (Exercise) 15 13 15      Symptoms none none none     Comments walk test results second full day of exercise  -     Duration  - Progress to 45 minutes of aerobic exercise without signs/symptoms of physical distress Continue with 45 min of aerobic exercise without signs/symptoms of physical distress.     Intensity  - THRR unchanged THRR unchanged       Progression   Progression  - Continue to progress workloads to maintain intensity without signs/symptoms of physical distress. Continue to progress workloads to maintain intensity without signs/symptoms of physical distress.     Average METs  - 2.62 3.02       Resistance Training   Training Prescription  - Yes Yes     Weight  - 3 lbs 3 lbs     Reps  - 10-15 10-15       Interval Training   Interval Training  - No No       Treadmill   MPH  - 3.2 3.2     Grade  - 0.5 0.5     Minutes  - 15 15     METs  - 3.67 3.67       Recumbant Elliptical   Level  - 2 2     Minutes  - 15 15  METs  - 1.9 2.5       T5 Nustep   Level  - 3 3     Minutes  - 15 15     METs  - 2.3 2.9       Home Exercise Plan   Plans to continue exercise at  -  - Home (comment)  walking     Frequency  -  -  Add 3 additional days to program exercise sessions.     Initial Home Exercises Provided  -  - 06/01/17        Exercise Comments:     Exercise Comments    Row Name 05/13/17 0907           Exercise Comments  First full day of exercise!  Patient was oriented to gym and equipment including functions, settings, policies, and procedures.  Patient's individual exercise prescription and treatment plan were reviewed.  All starting workloads were established based on the results of the 6 minute walk test done at initial orientation visit.  The plan for exercise progression was also introduced and progression will be customized based on patient's performance and goals.          Exercise Goals and Review:     Exercise Goals    Row Name 05/10/17 1449             Exercise Goals   Increase Physical Activity Yes       Intervention Provide advice, education, support and counseling about physical activity/exercise needs.;Develop an individualized exercise prescription for aerobic and resistive training based on initial evaluation findings, risk stratification, comorbidities and participant's personal goals.       Expected Outcomes Achievement of increased cardiorespiratory fitness and enhanced flexibility, muscular endurance and strength shown through measurements of functional capacity and personal statement of participant.       Increase Strength and Stamina Yes       Intervention Provide advice, education, support and counseling about physical activity/exercise needs.;Develop an individualized exercise prescription for aerobic and resistive training based on initial evaluation findings, risk stratification, comorbidities and participant's personal goals.       Expected Outcomes Achievement of increased cardiorespiratory fitness and enhanced flexibility, muscular endurance and strength shown through measurements of functional capacity and personal statement of participant.          Exercise  Goals Re-Evaluation :     Exercise Goals Re-Evaluation    Row Name 05/19/17 1441 05/25/17 1105 06/01/17 0901 06/01/17 1444       Exercise Goal Re-Evaluation   Exercise Goals Review Increase Physical Activity;Increase Strenth and Stamina Increase Physical Activity;Understanding of Exercise Prescription Increase Physical Activity;Increase Strength and Stamina;Able to understand and use rate of perceived exertion (RPE) scale;Knowledge and understanding of Target Heart Rate Range (THRR);Able to check pulse independently;Understanding of Exercise Prescription Increase Physical Activity;Increase Strength and Stamina    Comments Zyhir is off to a good start in rehab.  He has completed two full days of exercise.  He seems to want to work hard while here in class.  He had a little difficulty at first on the treadmill, but did better as he got more comfortable on the treadmill. We will continue to monitor his progression.  Floyed has added 3 days per week of walking to his exercise routine.  He is motivated to participate and work towards goals.  he has not yet added levels to his machines in HT.  Reviewed home exercise with pt today.  Pt plans to  increase walking for exercise.  Reviewed THR, pulse, RPE, sign and symptoms, NTG use, and when to call 911 or MD.  Also discussed weather considerations and indoor options.  Pt voiced understanding. Ranjit has been doing well in rehab.  He already walks 30 min on his off and will increase to 45 min. He has gotten up to 2.9 METs on the NuStep.  We will continue to monitor his progression.     Expected Outcomes Short: Increase workload on the XR and review home exercise guidelines.  Long: Start to make exercise part of his routine.  Short - continue to. progress workloads and walking.  Long - Exercise will become a regular routine Short - Jumar will increase walk time to 45 min.  Long - Abdulhadi will maintain exercise on his own. Short: Increase home exercsie time and increase  workloads.  Long: Continue to exercise independently.        Discharge Exercise Prescription (Final Exercise Prescription Changes):     Exercise Prescription Changes - 06/01/17 1500      Response to Exercise   Blood Pressure (Admit) 104/64   Blood Pressure (Exercise) 148/66   Blood Pressure (Exit) 102/60   Heart Rate (Admit) 76 bpm   Heart Rate (Exercise) 119 bpm   Heart Rate (Exit) 63 bpm   Rating of Perceived Exertion (Exercise) 15   Symptoms none   Duration Continue with 45 min of aerobic exercise without signs/symptoms of physical distress.   Intensity THRR unchanged     Progression   Progression Continue to progress workloads to maintain intensity without signs/symptoms of physical distress.   Average METs 3.02     Resistance Training   Training Prescription Yes   Weight 3 lbs   Reps 10-15     Interval Training   Interval Training No     Treadmill   MPH 3.2   Grade 0.5   Minutes 15   METs 3.67     Recumbant Elliptical   Level 2   Minutes 15   METs 2.5     T5 Nustep   Level 3   Minutes 15   METs 2.9     Home Exercise Plan   Plans to continue exercise at Home (comment)  walking   Frequency Add 3 additional days to program exercise sessions.   Initial Home Exercises Provided 06/01/17      Nutrition:  Target Goals: Understanding of nutrition guidelines, daily intake of sodium 1500mg , cholesterol 200mg , calories 30% from fat and 7% or less from saturated fats, daily to have 5 or more servings of fruits and vegetables.  Biometrics:     Pre Biometrics - 05/10/17 1449      Pre Biometrics   Height 5' 8.6" (1.742 m)   Weight 179 lb 12.8 oz (81.6 kg)   Waist Circumference 38 inches   Hip Circumference 39 inches   Waist to Hip Ratio 0.97 %   BMI (Calculated) 26.9   Single Leg Stand 1.35 seconds       Nutrition Therapy Plan and Nutrition Goals:     Nutrition Therapy & Goals - 05/10/17 1419      Intervention Plan   Intervention Prescribe,  educate and counsel regarding individualized specific dietary modifications aiming towards targeted core components such as weight, hypertension, lipid management, diabetes, heart failure and other comorbidities.   Expected Outcomes Short Term Goal: Understand basic principles of dietary content, such as calories, fat, sodium, cholesterol and nutrients.;Short Term Goal: A plan has been  developed with personal nutrition goals set during dietitian appointment.;Long Term Goal: Adherence to prescribed nutrition plan.      Nutrition Discharge: Rate Your Plate Scores:     Nutrition Assessments - 05/10/17 1423      MEDFICTS Scores   Pre Score 27      Nutrition Goals Re-Evaluation:   Nutrition Goals Discharge (Final Nutrition Goals Re-Evaluation):   Psychosocial: Target Goals: Acknowledge presence or absence of significant depression and/or stress, maximize coping skills, provide positive support system. Participant is able to verbalize types and ability to use techniques and skills needed for reducing stress and depression.   Initial Review & Psychosocial Screening:     Initial Psych Review & Screening - 05/10/17 1429      Initial Review   Current issues with None Identified     Family Dynamics   Good Support System? Yes  Friends   Comments Spouse does not live with Lanny Hurst     Barriers   Psychosocial barriers to participate in program There are no identifiable barriers or psychosocial needs.;The patient should benefit from training in stress management and relaxation.     Screening Interventions   Interventions Encouraged to exercise;To provide support and resources with identified psychosocial needs;Provide feedback about the scores to participant      Quality of Life Scores:      Quality of Life - 05/10/17 1429      Quality of Life Scores   Health/Function Pre 24.43 %   Socioeconomic Pre 23.5 %   Psych/Spiritual Pre 21.88 %   Family Pre 23.7 %   GLOBAL Pre 23.61 %       PHQ-9: Recent Review Flowsheet Data    Depression screen [facility] 2/9 05/10/2017   Decreased Interest 0   Down, Depressed, Hopeless 0   PHQ - 2 Score 0   Altered sleeping 0   Tired, decreased energy 1   Change in appetite 0   Feeling bad or failure about yourself  0   Trouble concentrating 0   Moving slowly or fidgety/restless 0   Suicidal thoughts 0   PHQ-9 Score 1   Difficult doing work/chores Not difficult at all     Interpretation of Total Score  Total Score Depression Severity:  1-4 = Minimal depression, 5-9 = Mild depression, 10-14 = Moderate depression, 15-19 = Moderately severe depression, 20-27 = Severe depression   Psychosocial Evaluation and Intervention:   Psychosocial Re-Evaluation:   Psychosocial Discharge (Final Psychosocial Re-Evaluation):   Vocational Rehabilitation: Provide vocational rehab assistance to qualifying candidates.   Vocational Rehab Evaluation & Intervention:     Vocational Rehab - 05/10/17 1432      Initial Vocational Rehab Evaluation & Intervention   Assessment shows need for Vocational Rehabilitation No      Education: Education Goals: Education classes will be provided on a variety of topics geared toward better understanding of heart health and risk factor modification. Participant will state understanding/return demonstration of topics presented as noted by education test scores.  Learning Barriers/Preferences:     Learning Barriers/Preferences - 05/10/17 1431      Learning Barriers/Preferences   Learning Barriers None   Learning Preferences None      Education Topics: General Nutrition Guidelines/Fats and Fiber: -Group instruction provided by verbal, written material, models and posters to present the general guidelines for heart healthy nutrition. Gives an explanation and review of dietary fats and fiber.   Cardiac Rehab from 06/08/2017 in [facility] Cardiac and Pulmonary Rehab  Date  05/18/17  Educator  CR  Instruction  Review Code  1- Verbalizes Understanding      Controlling Sodium/Reading Food Labels: -Group verbal and written material supporting the discussion of sodium use in heart healthy nutrition. Review and explanation with models, verbal and written materials for utilization of the food label.   Cardiac Rehab from 06/08/2017 in [facility] Cardiac and Pulmonary Rehab  Date  05/25/17  Educator  PI  Instruction Review Code  1- Verbalizes Understanding      Exercise Physiology & Risk Factors: - Group verbal and written instruction with models to review the exercise physiology of the cardiovascular system and associated critical values. Details cardiovascular disease risk factors and the goals associated with each risk factor.   Cardiac Rehab from 06/08/2017 in [facility] Cardiac and Pulmonary Rehab  Date  06/03/17  Educator  [facility]  Instruction Review Code  1- Verbalizes Understanding      Aerobic Exercise & Resistance Training: - Gives group verbal and written discussion on the health impact of inactivity. On the components of aerobic and resistive training programs and the benefits of this training and how to safely progress through these programs.   Cardiac Rehab from 06/08/2017 in [facility] Cardiac and Pulmonary Rehab  Date  06/08/17  Educator  [facility]  Instruction Review Code  1- Verbalizes Understanding      Flexibility, Balance, General Exercise Guidelines: - Provides group verbal and written instruction on the benefits of flexibility and balance training programs. Provides general exercise guidelines with specific guidelines to those with heart or lung disease. Demonstration and skill practice provided.   Stress Management: - Provides group verbal and written instruction about the health risks of elevated stress, cause of high stress, and healthy ways to reduce stress.   Depression: - Provides group verbal and written instruction on the correlation between heart/lung disease and depressed mood, treatment  options, and the stigmas associated with seeking treatment.   Cardiac Rehab from 06/08/2017 in [facility] Cardiac and Pulmonary Rehab  Date  05/13/17  Educator  [facility]  Instruction Review Code (retired)  2- meets Designer, fashion/clothing & Physiology of the Heart: - Group verbal and written instruction and models provide basic cardiac anatomy and physiology, with the coronary electrical and arterial systems. Review of: AMI, Angina, Valve disease, Heart Failure, Cardiac Arrhythmia, Pacemakers, and the ICD.   Cardiac Procedures: - Group verbal and written instruction to review commonly prescribed medications for heart disease. Reviews the medication, class of the drug, and side effects. Includes the steps to properly store meds and maintain the prescription regimen. (beta blockers and nitrates)   Cardiac Medications I: - Group verbal and written instruction to review commonly prescribed medications for heart disease. Reviews the medication, class of the drug, and side effects. Includes the steps to properly store meds and maintain the prescription regimen.   Cardiac Medications II: -Group verbal and written instruction to review commonly prescribed medications for heart disease. Reviews the medication, class of the drug, and side effects. (all other drug classes)    Go Sex-Intimacy & Heart Disease, Get SMART - Goal Setting: - Group verbal and written instruction through game format to discuss heart disease and the return to sexual intimacy. Provides group verbal and written material to discuss and apply goal setting through the application of the S.M.A.R.T. Method.   Other Matters of the Heart: - Provides group verbal, written materials and models to describe Heart Failure, Angina, Valve Disease, Peripheral Artery Disease, and Diabetes in the realm  of heart disease. Includes description of the disease process and treatment options available to the cardiac patient.   Exercise & Equipment  Safety: - Individual verbal instruction and demonstration of equipment use and safety with use of the equipment.   Cardiac Rehab from 06/08/2017 in [facility] Cardiac and Pulmonary Rehab  Date  05/10/17  Educator  Sb      Infection Prevention: - Provides verbal and written material to individual with discussion of infection control including proper hand washing and proper equipment cleaning during exercise session.   Cardiac Rehab from 06/08/2017 in [facility] Cardiac and Pulmonary Rehab  Date  05/10/17  Educator  SB      Falls Prevention: - Provides verbal and written material to individual with discussion of falls prevention and safety.   Cardiac Rehab from 06/08/2017 in [facility] Cardiac and Pulmonary Rehab  Date  05/10/17  Educator  Sb  Instruction Review Code (retired)  2- meets goals/outcomes      Diabetes: - Individual verbal and written instruction to review signs/symptoms of diabetes, desired ranges of glucose level fasting, after meals and with exercise. Acknowledge that pre and post exercise glucose checks will be done for 3 sessions at entry of program.   Other: -Provides group and verbal instruction on various topics (see comments)    Knowledge Questionnaire Score:     Knowledge Questionnaire Score - 05/10/17 1431      Knowledge Questionnaire Score   Pre Score 27/28  Reviewed correct response with Lanny Hurst. He verbalized understanding of correct response.      Core Components/Risk Factors/Patient Goals at Admission:     Personal Goals and Risk Factors at Admission - 05/10/17 1427      Core Components/Risk Factors/Patient Goals on Admission    Weight Management Yes;Weight Loss   Intervention Weight Management: Develop a combined nutrition and exercise program designed to reach desired caloric intake, while maintaining appropriate intake of nutrient and fiber, sodium and fats, and appropriate energy expenditure required for the weight goal.;Weight Management/Obesity: Establish  reasonable short term and long term weight goals.;Obesity: Provide education and appropriate resources to help participant work on and attain dietary goals.;Weight Management: Provide education and appropriate resources to help participant work on and attain dietary goals.  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   Admit Weight 179 lb 12.8 oz (81.6 kg)   Goal Weight: Short Term 177 lb (80.3 kg)   Goal Weight: Long Term 155 lb (70.3 kg)   Expected Outcomes Long Term: Adherence to nutrition and physical activity/exercise program aimed toward attainment of established weight goal;Short Term: Continue to assess and modify interventions until short term weight is achieved;Weight Loss: Understanding of general recommendations for a balanced deficit meal plan, which promotes 1-2 lb weight loss per week and includes a negative energy balance of [phone] kcal/d   Hypertension Yes   Intervention Provide education on lifestyle modifcations including regular physical activity/exercise, weight management, moderate sodium restriction and increased consumption of fresh fruit, vegetables, and low fat dairy, alcohol moderation, and smoking cessation.;Monitor prescription use compliance.   Expected Outcomes Short Term: Continued assessment and intervention until BP is < 140/64mm HG in hypertensive participants. < 130/1mm HG in hypertensive participants with diabetes, heart failure or chronic kidney disease.;Long Term: Maintenance of blood pressure at goal levels.   Lipids Yes   Intervention Provide education and support for participant on nutrition & aerobic/resistive exercise along with prescribed medications to achieve LDL 70mg , HDL >40mg .  Expected Outcomes Short Term: Participant states understanding of desired cholesterol values and is compliant with medications prescribed. Participant is following exercise prescription and nutrition guidelines.;Long Term: Cholesterol  controlled with medications as prescribed, with individualized exercise RX and with personalized nutrition plan. Value goals: LDL < 70mg , HDL > 40 mg.      Core Components/Risk Factors/Patient Goals Review:      Goals and Risk Factor Review    Row Name 05/25/17 1100             Core Components/Risk Factors/Patient Goals Review   Personal Goals Review Weight Management/Obesity;Hypertension       Review Veras goal is to lose 20 lb.  He rarely eats meat and is keeping sodium below 1500 mg.  His BPs have been good when he attends HT.  He is also walking for 30 min on Friday Saturday and Sunday .       Expected Outcomes Short - Grigor will coontinue regular exercise and healthy dietary changes.  Long - Rivan will lose 1-2 lb per week.          Core Components/Risk Factors/Patient Goals at Discharge (Final Review):      Goals and Risk Factor Review - 05/25/17 1100      Core Components/Risk Factors/Patient Goals Review   Personal Goals Review Weight Management/Obesity;Hypertension   Review Lea goal is to lose 20 lb.  He rarely eats meat and is keeping sodium below 1500 mg.  His BPs have been good when he attends HT.  He is also walking for 30 min on Friday Saturday and Sunday .   Expected Outcomes Short - Vicky will coontinue regular exercise and healthy dietary changes.  Long - [place] will lose 1-2 lb per week.      ITP Comments:     ITP Comments    Row Name 05/10/17 1409 06/09/17 1105         ITP Comments Medical review completed today. Initial ITP created fro review, changes as needed and cosign by Dr Loleta Chance.  Documentation of diagnosis can be found in [facility] 05/01/2017 Admission 30 day review. Continue with ITP unless directed changes per Medical Director review.           Comments:

## 2017-06-09 NOTE — Progress Notes (Signed)
Cardiac Individual Treatment Plan  Patient Details  Name: Joseph Esparza MRN: [id number] Date of Birth: [birth year] Referring Provider:     Cardiac Rehab from 05/10/2017 in [facility] Cardiac and Pulmonary Rehab  Referring Provider  Lujean Amel MD      Initial Encounter Date:    Cardiac Rehab from 05/10/2017 in [facility] Cardiac and Pulmonary Rehab  Date  05/10/17  Referring Provider  Lujean Amel MD      Visit Diagnosis: ST elevation myocardial infarction (STEMI), unspecified artery [facility])  Status post coronary artery stent placement  Patient's Home Medications on Admission:  Current Outpatient Prescriptions:  .  aspirin 81 MG chewable tablet, Chew 1 tablet (81 mg total) by mouth daily., Disp: 30 tablet, Rfl: 3 .  atorvastatin (LIPITOR) 80 MG tablet, Take 1 tablet (80 mg total) by mouth daily at 6 PM., Disp: 30 tablet, Rfl: 3 .  clopidogrel (PLAVIX) 75 MG tablet, Take 1 tablet (75 mg total) by mouth daily with breakfast., Disp: 30 tablet, Rfl: 3 .  lisinopril (PRINIVIL,ZESTRIL) 5 MG tablet, Take 1 tablet (5 mg total) by mouth 2 (two) times daily., Disp: 30 tablet, Rfl: 3 .  metoprolol tartrate (LOPRESSOR) 25 MG tablet, Take 0.5 tablets (12.5 mg total) by mouth 2 (two) times daily., Disp: 30 tablet, Rfl: 3  Past Medical History: History reviewed. No pertinent past medical history.  Tobacco Use: History  Smoking Status  . Former Smoker  . Packs/day: 0.50  . Years: 10.00  . Types: Cigarettes  . Quit date: 09/29/1963  Smokeless Tobacco  . Never Used    Comment: Quit in his 20's  smoked maybe 10 years    Labs: Recent Review Flowsheet Data    Labs for ITP Cardiac and Pulmonary Rehab Latest Ref Rng & Units 05/01/2017 05/02/2017   Cholestrol 0 - 200 mg/dL 239(H) -   LDLCALC 0 - 99 mg/dL 175(H) -   HDL >40 mg/dL 36(L) -   Trlycerides <150 mg/dL 141 -   Hemoglobin A1c 4.8 - 5.6 % - 5.8(H)       Exercise Target Goals: Date: 05/10/17  Exercise Program Goal: Individual exercise  prescription set with THRR, safety & activity barriers. Participant demonstrates ability to understand and report RPE using BORG scale, to self-measure pulse accurately, and to acknowledge the importance of the exercise prescription.  Exercise Prescription Goal: Starting with aerobic activity 30 plus minutes a day, 3 days per week for initial exercise prescription. Provide home exercise prescription and guidelines that participant acknowledges understanding prior to discharge.  Activity Barriers & Risk Stratification:     Activity Barriers & Cardiac Risk Stratification - 05/10/17 1413      Activity Barriers & Cardiac Risk Stratification   Activity Barriers Joint Problems;Deconditioning;Balance Concerns  R knee occasionaly feels weak   Cardiac Risk Stratification High      6 Minute Walk:     6 Minute Walk    Row Name 05/10/17 1445         6 Minute Walk   Phase Initial     Distance 1690 feet     Walk Time 6 minutes     # of Rest Breaks 0     MPH 3.2     METS 3.8     RPE 15     VO2 Peak 13.31     Symptoms No     Resting HR 62 bpm     Resting BP 144/70     Max Ex. HR 111 bpm  Max Ex. BP 156/74     2 Minute Post BP 132/70        Oxygen Initial Assessment:   Oxygen Re-Evaluation:   Oxygen Discharge (Final Oxygen Re-Evaluation):   Initial Exercise Prescription:     Initial Exercise Prescription - 05/10/17 1400      Date of Initial Exercise RX and Referring Provider   Date 05/10/17   Referring Provider Lujean Amel MD     Treadmill   MPH 3.2   Grade 0.5   Minutes 15   METs 3.67     Recumbant Elliptical   Level 2   RPM 50   Minutes 15   METs 3     T5 Nustep   Level 3   SPM 100   Minutes 15   METs 3     Prescription Details   Frequency (times per week) 2   Duration Progress to 45 minutes of aerobic exercise without signs/symptoms of physical distress     Intensity   THRR 40-80% of Max Heartrate 96-130   Ratings of Perceived Exertion  11-13   Perceived Dyspnea 0-4     Progression   Progression Continue to progress workloads to maintain intensity without signs/symptoms of physical distress.     Resistance Training   Training Prescription Yes   Weight 4 lbs      Perform Capillary Blood Glucose checks as needed.  Exercise Prescription Changes:     Exercise Prescription Changes    Row Name 05/10/17 1400 05/19/17 1400 06/01/17 1500         Response to Exercise   Blood Pressure (Admit) 144/70 100/60 104/64     Blood Pressure (Exercise) 156/74 134/64 148/66     Blood Pressure (Exit) 132/70 124/70 102/60     Heart Rate (Admit) 62 bpm 63 bpm 76 bpm     Heart Rate (Exercise) 111 bpm 106 bpm 119 bpm     Heart Rate (Exit) 67 bpm 77 bpm 63 bpm     Oxygen Saturation (Admit) 97 %  -  -     Oxygen Saturation (Exercise) 97 %  -  -     Rating of Perceived Exertion (Exercise) 15 13 15      Symptoms none none none     Comments walk test results second full day of exercise  -     Duration  - Progress to 45 minutes of aerobic exercise without signs/symptoms of physical distress Continue with 45 min of aerobic exercise without signs/symptoms of physical distress.     Intensity  - THRR unchanged THRR unchanged       Progression   Progression  - Continue to progress workloads to maintain intensity without signs/symptoms of physical distress. Continue to progress workloads to maintain intensity without signs/symptoms of physical distress.     Average METs  - 2.62 3.02       Resistance Training   Training Prescription  - Yes Yes     Weight  - 3 lbs 3 lbs     Reps  - 10-15 10-15       Interval Training   Interval Training  - No No       Treadmill   MPH  - 3.2 3.2     Grade  - 0.5 0.5     Minutes  - 15 15     METs  - 3.67 3.67       Recumbant Elliptical   Level  - 2 2  Minutes  - 15 15     METs  - 1.9 2.5       T5 Nustep   Level  - 3 3     Minutes  - 15 15     METs  - 2.3 2.9       Home Exercise Plan   Plans  to continue exercise at  -  - Home (comment)  walking     Frequency  -  - Add 3 additional days to program exercise sessions.     Initial Home Exercises Provided  -  - 06/01/17        Exercise Comments:     Exercise Comments    Row Name 05/13/17 0907           Exercise Comments  First full day of exercise!  Patient was oriented to gym and equipment including functions, settings, policies, and procedures.  Patient's individual exercise prescription and treatment plan were reviewed.  All starting workloads were established based on the results of the 6 minute walk test done at initial orientation visit.  The plan for exercise progression was also introduced and progression will be customized based on patient's performance and goals.          Exercise Goals and Review:     Exercise Goals    Row Name 05/10/17 1449             Exercise Goals   Increase Physical Activity Yes       Intervention Provide advice, education, support and counseling about physical activity/exercise needs.;Develop an individualized exercise prescription for aerobic and resistive training based on initial evaluation findings, risk stratification, comorbidities and participant's personal goals.       Expected Outcomes Achievement of increased cardiorespiratory fitness and enhanced flexibility, muscular endurance and strength shown through measurements of functional capacity and personal statement of participant.       Increase Strength and Stamina Yes       Intervention Provide advice, education, support and counseling about physical activity/exercise needs.;Develop an individualized exercise prescription for aerobic and resistive training based on initial evaluation findings, risk stratification, comorbidities and participant's personal goals.       Expected Outcomes Achievement of increased cardiorespiratory fitness and enhanced flexibility, muscular endurance and strength shown through measurements of  functional capacity and personal statement of participant.          Exercise Goals Re-Evaluation :     Exercise Goals Re-Evaluation    Row Name 05/19/17 1441 05/25/17 1105 06/01/17 0901 06/01/17 1444       Exercise Goal Re-Evaluation   Exercise Goals Review Increase Physical Activity;Increase Strenth and Stamina Increase Physical Activity;Understanding of Exercise Prescription Increase Physical Activity;Increase Strength and Stamina;Able to understand and use rate of perceived exertion (RPE) scale;Knowledge and understanding of Target Heart Rate Range (THRR);Able to check pulse independently;Understanding of Exercise Prescription Increase Physical Activity;Increase Strength and Stamina    Comments Spence is off to a good start in rehab.  He has completed two full days of exercise.  He seems to want to work hard while here in class.  He had a little difficulty at first on the treadmill, but did better as he got more comfortable on the treadmill. We will continue to monitor his progression.  Osei has added 3 days per week of walking to his exercise routine.  He is motivated to participate and work towards goals.  he has not yet added levels to his machines in HT.  Reviewed  home exercise with pt today.  Pt plans to increase walking for exercise.  Reviewed THR, pulse, RPE, sign and symptoms, NTG use, and when to call 911 or MD.  Also discussed weather considerations and indoor options.  Pt voiced understanding. Han has been doing well in rehab.  He already walks 30 min on his off and will increase to 45 min. He has gotten up to 2.9 METs on the NuStep.  We will continue to monitor his progression.     Expected Outcomes Short: Increase workload on the XR and review home exercise guidelines.  Long: Start to make exercise part of his routine.  Short - continue to. progress workloads and walking.  Long - Exercise will become a regular routine Short - Keian will increase walk time to 45 min.  Long - Masahiro will  maintain exercise on his own. Short: Increase home exercsie time and increase workloads.  Long: Continue to exercise independently.        Discharge Exercise Prescription (Final Exercise Prescription Changes):     Exercise Prescription Changes - 06/01/17 1500      Response to Exercise   Blood Pressure (Admit) 104/64   Blood Pressure (Exercise) 148/66   Blood Pressure (Exit) 102/60   Heart Rate (Admit) 76 bpm   Heart Rate (Exercise) 119 bpm   Heart Rate (Exit) 63 bpm   Rating of Perceived Exertion (Exercise) 15   Symptoms none   Duration Continue with 45 min of aerobic exercise without signs/symptoms of physical distress.   Intensity THRR unchanged     Progression   Progression Continue to progress workloads to maintain intensity without signs/symptoms of physical distress.   Average METs 3.02     Resistance Training   Training Prescription Yes   Weight 3 lbs   Reps 10-15     Interval Training   Interval Training No     Treadmill   MPH 3.2   Grade 0.5   Minutes 15   METs 3.67     Recumbant Elliptical   Level 2   Minutes 15   METs 2.5     T5 Nustep   Level 3   Minutes 15   METs 2.9     Home Exercise Plan   Plans to continue exercise at Home (comment)  walking   Frequency Add 3 additional days to program exercise sessions.   Initial Home Exercises Provided 06/01/17      Nutrition:  Target Goals: Understanding of nutrition guidelines, daily intake of sodium 1500mg , cholesterol 200mg , calories 30% from fat and 7% or less from saturated fats, daily to have 5 or more servings of fruits and vegetables.  Biometrics:     Pre Biometrics - 05/10/17 1449      Pre Biometrics   Height 5' 8.6" (1.742 m)   Weight 179 lb 12.8 oz (81.6 kg)   Waist Circumference 38 inches   Hip Circumference 39 inches   Waist to Hip Ratio 0.97 %   BMI (Calculated) 26.9   Single Leg Stand 1.35 seconds       Nutrition Therapy Plan and Nutrition Goals:     Nutrition Therapy  & Goals - 06/09/17 1157      Nutrition Therapy   RD appointment defered Yes      Nutrition Discharge: Rate Your Plate Scores:     Nutrition Assessments - 05/10/17 1423      MEDFICTS Scores   Pre Score 27      Nutrition Goals Re-Evaluation:  Nutrition Goals Re-Evaluation    [place] Name 06/09/17 1200             Goals   Nutrition Goal Cont to eat healthy. Eain eats very little meat.       Expected Outcome Heart healthy eating to cont. it.           Nutrition Goals Discharge (Final Nutrition Goals Re-Evaluation):     Nutrition Goals Re-Evaluation - 06/09/17 1200      Goals   Nutrition Goal Cont to eat healthy. Dimitrios eats very little meat.   Expected Outcome Heart healthy eating to cont. it.       Psychosocial: Target Goals: Acknowledge presence or absence of significant depression and/or stress, maximize coping skills, provide positive support system. Participant is able to verbalize types and ability to use techniques and skills needed for reducing stress and depression.   Initial Review & Psychosocial Screening:     Initial Psych Review & Screening - 05/10/17 1429      Initial Review   Current issues with None Identified     Family Dynamics   Good Support System? Yes  Friends   Comments Spouse does not live with Lanny Hurst     Barriers   Psychosocial barriers to participate in program There are no identifiable barriers or psychosocial needs.;The patient should benefit from training in stress management and relaxation.     Screening Interventions   Interventions Encouraged to exercise;To provide support and resources with identified psychosocial needs;Provide feedback about the scores to participant      Quality of Life Scores:      Quality of Life - 05/10/17 1429      Quality of Life Scores   Health/Function Pre 24.43 %   Socioeconomic Pre 23.5 %   Psych/Spiritual Pre 21.88 %   Family Pre 23.7 %   GLOBAL Pre 23.61 %      PHQ-9: Recent  Review Flowsheet Data    Depression screen [facility] 2/9 05/10/2017   Decreased Interest 0   Down, Depressed, Hopeless 0   PHQ - 2 Score 0   Altered sleeping 0   Tired, decreased energy 1   Change in appetite 0   Feeling bad or failure about yourself  0   Trouble concentrating 0   Moving slowly or fidgety/restless 0   Suicidal thoughts 0   PHQ-9 Score 1   Difficult doing work/chores Not difficult at all     Interpretation of Total Score  Total Score Depression Severity:  1-4 = Minimal depression, 5-9 = Mild depression, 10-14 = Moderate depression, 15-19 = Moderately severe depression, 20-27 = Severe depression   Psychosocial Evaluation and Intervention:   Psychosocial Re-Evaluation:     Psychosocial Re-Evaluation    [place] Name 06/09/17 1200 06/09/17 1201 06/09/17 1202         Psychosocial Re-Evaluation   Current issues with None Identified  -  -     Comments Spouse does not live with him was reported before. PHQ9 score which scores for depression was very good at 0.  His quality of life scores were very good also.      Interventions Encouraged to attend Cardiac Rehabilitation for the exercise  -  -     Continue Psychosocial Services  Follow up required by staff  -  -        Psychosocial Discharge (Final Psychosocial Re-Evaluation):     Psychosocial Re-Evaluation - 06/09/17 1202      Psychosocial Re-Evaluation  Comments His quality of life scores were very good also.       Vocational Rehabilitation: Provide vocational rehab assistance to qualifying candidates.   Vocational Rehab Evaluation & Intervention:     Vocational Rehab - 05/10/17 1432      Initial Vocational Rehab Evaluation & Intervention   Assessment shows need for Vocational Rehabilitation No      Education: Education Goals: Education classes will be provided on a variety of topics geared toward better understanding of heart health and risk factor modification. Participant will state  understanding/return demonstration of topics presented as noted by education test scores.  Learning Barriers/Preferences:     Learning Barriers/Preferences - 05/10/17 1431      Learning Barriers/Preferences   Learning Barriers None   Learning Preferences None      Education Topics: General Nutrition Guidelines/Fats and Fiber: -Group instruction provided by verbal, written material, models and posters to present the general guidelines for heart healthy nutrition. Gives an explanation and review of dietary fats and fiber.   Controlling Sodium/Reading Food Labels: -Group verbal and written material supporting the discussion of sodium use in heart healthy nutrition. Review and explanation with models, verbal and written materials for utilization of the food label.   Exercise Physiology & Risk Factors: - Group verbal and written instruction with models to review the exercise physiology of the cardiovascular system and associated critical values. Details cardiovascular disease risk factors and the goals associated with each risk factor.   Aerobic Exercise & Resistance Training: - Gives group verbal and written discussion on the health impact of inactivity. On the components of aerobic and resistive training programs and the benefits of this training and how to safely progress through these programs.   Flexibility, Balance, General Exercise Guidelines: - Provides group verbal and written instruction on the benefits of flexibility and balance training programs. Provides general exercise guidelines with specific guidelines to those with heart or lung disease. Demonstration and skill practice provided.   Stress Management: - Provides group verbal and written instruction about the health risks of elevated stress, cause of high stress, and healthy ways to reduce stress.   Depression: - Provides group verbal and written instruction on the correlation between heart/lung disease and depressed  mood, treatment options, and the stigmas associated with seeking treatment.   Anatomy & Physiology of the Heart: - Group verbal and written instruction and models provide basic cardiac anatomy and physiology, with the coronary electrical and arterial systems. Review of: AMI, Angina, Valve disease, Heart Failure, Cardiac Arrhythmia, Pacemakers, and the ICD.   Cardiac Procedures: - Group verbal and written instruction to review commonly prescribed medications for heart disease. Reviews the medication, class of the drug, and side effects. Includes the steps to properly store meds and maintain the prescription regimen. (beta blockers and nitrates)   Cardiac Medications I: - Group verbal and written instruction to review commonly prescribed medications for heart disease. Reviews the medication, class of the drug, and side effects. Includes the steps to properly store meds and maintain the prescription regimen.   Cardiac Medications II: -Group verbal and written instruction to review commonly prescribed medications for heart disease. Reviews the medication, class of the drug, and side effects. (all other drug classes)    Go Sex-Intimacy & Heart Disease, Get SMART - Goal Setting: - Group verbal and written instruction through game format to discuss heart disease and the return to sexual intimacy. Provides group verbal and written material to discuss and apply goal setting through the  application of the S.M.A.R.T. Method.   Other Matters of the Heart: - Provides group verbal, written materials and models to describe Heart Failure, Angina, Valve Disease, Peripheral Artery Disease, and Diabetes in the realm of heart disease. Includes description of the disease process and treatment options available to the cardiac patient.   Exercise & Equipment Safety: - Individual verbal instruction and demonstration of equipment use and safety with use of the equipment.   Cardiac Rehab from 05/10/2017 in [facility]  Cardiac and Pulmonary Rehab  Date  05/10/17  Educator  Sb  Instruction Review Code (retired)  2- meets goals/outcomes      Infection Prevention: - Provides verbal and written material to individual with discussion of infection control including proper hand washing and proper equipment cleaning during exercise session.   Cardiac Rehab from 05/10/2017 in [facility] Cardiac and Pulmonary Rehab  Date  05/10/17  Educator  SB  Instruction Review Code (retired)  2- meets [employer] Prevention: - Provides verbal and written material to individual with discussion of falls prevention and safety.   Cardiac Rehab from 05/10/2017 in [facility] Cardiac and Pulmonary Rehab  Date  05/10/17  Educator  Sb  Instruction Review Code (retired)  2- meets goals/outcomes      Diabetes: - Individual verbal and written instruction to review signs/symptoms of diabetes, desired ranges of glucose level fasting, after meals and with exercise. Acknowledge that pre and post exercise glucose checks will be done for 3 sessions at entry of program.   Other: -Provides group and verbal instruction on various topics (see comments)    Knowledge Questionnaire Score:     Knowledge Questionnaire Score - 05/10/17 1431      Knowledge Questionnaire Score   Pre Score 27/28  Reviewed correct response with Lanny Hurst. He verbalized understanding of correct response.      Core Components/Risk Factors/Patient Goals at Admission:     Personal Goals and Risk Factors at Admission - 05/10/17 1427      Core Components/Risk Factors/Patient Goals on Admission    Weight Management Yes;Weight Loss   Intervention Weight Management: Develop a combined nutrition and exercise program designed to reach desired caloric intake, while maintaining appropriate intake of nutrient and fiber, sodium and fats, and appropriate energy expenditure required for the weight goal.;Weight Management/Obesity: Establish reasonable short term and long  term weight goals.;Obesity: Provide education and appropriate resources to help participant work on and attain dietary goals.;Weight Management: Provide education and appropriate resources to help participant work on and attain dietary goals.  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   Admit Weight 179 lb 12.8 oz (81.6 kg)   Goal Weight: Short Term 177 lb (80.3 kg)   Goal Weight: Long Term 155 lb (70.3 kg)   Expected Outcomes Long Term: Adherence to nutrition and physical activity/exercise program aimed toward attainment of established weight goal;Short Term: Continue to assess and modify interventions until short term weight is achieved;Weight Loss: Understanding of general recommendations for a balanced deficit meal plan, which promotes 1-2 lb weight loss per week and includes a negative energy balance of [phone] kcal/d   Hypertension Yes   Intervention Provide education on lifestyle modifcations including regular physical activity/exercise, weight management, moderate sodium restriction and increased consumption of fresh fruit, vegetables, and low fat dairy, alcohol moderation, and smoking cessation.;Monitor prescription use compliance.   Expected Outcomes Short Term: Continued assessment and intervention until BP is < 140/28mm HG  in hypertensive participants. < 130/67mm HG in hypertensive participants with diabetes, heart failure or chronic kidney disease.;Long Term: Maintenance of blood pressure at goal levels.   Lipids Yes   Intervention Provide education and support for participant on nutrition & aerobic/resistive exercise along with prescribed medications to achieve LDL 70mg , HDL >40mg .   Expected Outcomes Short Term: Participant states understanding of desired cholesterol values and is compliant with medications prescribed. Participant is following exercise prescription and nutrition guidelines.;Long Term: Cholesterol controlled with medications as  prescribed, with individualized exercise RX and with personalized nutrition plan. Value goals: LDL < 70mg , HDL > 40 mg.      Core Components/Risk Factors/Patient Goals Review:      Goals and Risk Factor Review    Row Name 05/25/17 1100             Core Components/Risk Factors/Patient Goals Review   Personal Goals Review Weight Management/Obesity;Hypertension       Review [place] goal is to lose 20 lb.  He rarely eats meat and is keeping sodium below 1500 mg.  His BPs have been good when he attends HT.  He is also walking for 30 min on Friday Saturday and Sunday .       Expected Outcomes Short - Radek will coontinue regular exercise and healthy dietary changes.  Long - Oziel will lose 1-2 lb per week.          Core Components/Risk Factors/Patient Goals at Discharge (Final Review):      Goals and Risk Factor Review - 05/25/17 1100      Core Components/Risk Factors/Patient Goals Review   Personal Goals Review Weight Management/Obesity;Hypertension   Review [place] goal is to lose 20 lb.  He rarely eats meat and is keeping sodium below 1500 mg.  His BPs have been good when he attends HT.  He is also walking for 30 min on Friday Saturday and Sunday .   Expected Outcomes Short - Dovid will coontinue regular exercise and healthy dietary changes.  Long - Adonijah will lose 1-2 lb per week.      ITP Comments:     ITP Comments    Row Name 05/10/17 1409 06/09/17 1105 06/09/17 1202       ITP Comments Medical review completed today. Initial ITP created fro review, changes as needed and cosign by Dr Loleta Chance.  Documentation of diagnosis can be found in [facility] 05/01/2017 Admission 30 day review. Continue with ITP unless directed changes per Medical Director review.   His PHQ9 scores which evaluate for depression were very good at zero.         Comments:

## 2017-06-10 ENCOUNTER — Encounter: Payer: Medicare Other | Admitting: *Deleted

## 2017-06-10 DIAGNOSIS — I213 ST elevation (STEMI) myocardial infarction of unspecified site: Secondary | ICD-10-CM

## 2017-06-10 DIAGNOSIS — Z48812 Encounter for surgical aftercare following surgery on the circulatory system: Secondary | ICD-10-CM | POA: Diagnosis not present

## 2017-06-10 DIAGNOSIS — Z955 Presence of coronary angioplasty implant and graft: Secondary | ICD-10-CM

## 2017-06-10 NOTE — Progress Notes (Signed)
Daily Session Note  Patient Details  Name: Joseph Esparza MRN: [id number] Date of Birth: 1946-07-05 Referring Provider:     Cardiac Rehab from 05/10/2017 in [facility] Cardiac and Pulmonary Rehab  Referring Provider  Lujean Amel MD      Encounter Date: 06/10/2017  Check In:     Session Check In - 06/10/17 0832      Check-In   Location [facility]-Cardiac & Pulmonary Rehab   Staff Present Alberteen Sam, MA, ACSM RCEP, Exercise Physiologist;Amanda Oletta Darter, BA, ACSM CEP, Exercise Physiologist;Meredith Sherryll Burger, RN BSN   Supervising physician immediately available to respond to emergencies See telemetry face sheet for immediately available ER MD   Medication changes reported     No   Fall or balance concerns reported    No   Warm-up and Cool-down Performed on first and last piece of equipment   Resistance Training Performed Yes   VAD Patient? No     Pain Assessment   Currently in Pain? No/denies   Multiple Pain Sites No         History  Smoking Status  . Former Smoker  . Packs/day: 0.50  . Years: 10.00  . Types: Cigarettes  . Quit date: 09/29/1963  Smokeless Tobacco  . Never Used    Comment: Quit in his 20's  smoked maybe 10 years    Goals Met:  Independence with exercise equipment Exercise tolerated well No report of cardiac concerns or symptoms Strength training completed today  Goals Unmet:  Not Applicable  Comments: Pt able to follow exercise prescription today without complaint.  Will continue to monitor for progression.    Dr. Emily Filbert is Medical Director for [place] and [facility].

## 2017-06-15 DIAGNOSIS — Z955 Presence of coronary angioplasty implant and graft: Secondary | ICD-10-CM

## 2017-06-15 DIAGNOSIS — Z48812 Encounter for surgical aftercare following surgery on the circulatory system: Secondary | ICD-10-CM | POA: Diagnosis not present

## 2017-06-15 DIAGNOSIS — I213 ST elevation (STEMI) myocardial infarction of unspecified site: Secondary | ICD-10-CM

## 2017-06-15 NOTE — Progress Notes (Signed)
Daily Session Note  Patient Details  Name: LOPEZ DENTINGER MRN: [id number] Date of Birth: [birth year] Referring Provider:     Cardiac Rehab from 05/10/2017 in [facility] Cardiac and Pulmonary Rehab  Referring Provider  Lujean Amel MD      Encounter Date: 06/15/2017  Check In:     Session Check In - 06/15/17 0905      Check-In   Location [facility]-Cardiac & Pulmonary Rehab   Staff Present Heath Lark, RN, BSN, CCRP;Jessica Outlook, MA, ACSM RCEP, Exercise Physiologist;Mell Guia Oletta Darter, BA, ACSM CEP, Exercise Physiologist   Supervising physician immediately available to respond to emergencies See telemetry face sheet for immediately available ER MD   Medication changes reported     No   Fall or balance concerns reported    No   Warm-up and Cool-down Performed on first and last piece of equipment   Resistance Training Performed Yes   VAD Patient? No     Pain Assessment   Currently in Pain? No/denies         History  Smoking Status  . Former Smoker  . Packs/day: 0.50  . Years: 10.00  . Types: Cigarettes  . Quit date: 09/29/1963  Smokeless Tobacco  . Never Used    Comment: Quit in his 20's  smoked maybe 10 years    Goals Met:  Independence with exercise equipment Exercise tolerated well No report of cardiac concerns or symptoms Strength training completed today  Goals Unmet:  Not Applicable  Comments: Pt able to follow exercise prescription today without complaint.  Will continue to monitor for progression.    Dr. Emily Filbert is Medical Director for [place] and [facility].

## 2017-06-17 DIAGNOSIS — Z48812 Encounter for surgical aftercare following surgery on the circulatory system: Secondary | ICD-10-CM | POA: Diagnosis not present

## 2017-06-17 DIAGNOSIS — Z955 Presence of coronary angioplasty implant and graft: Secondary | ICD-10-CM

## 2017-06-17 DIAGNOSIS — I213 ST elevation (STEMI) myocardial infarction of unspecified site: Secondary | ICD-10-CM

## 2017-06-17 NOTE — Progress Notes (Signed)
Daily Session Note  Patient Details  Name: Joseph Esparza MRN: [id number] Date of Birth: 08-25-1946 Referring Provider:     Cardiac Rehab from 05/10/2017 in [facility] Cardiac and Pulmonary Rehab  Referring Provider  Lujean Amel MD      Encounter Date: 06/17/2017  Check In:     Session Check In - 06/17/17 0830      Check-In   Location [facility]-Cardiac & Pulmonary Rehab   Staff Present Alberteen Sam, MA, ACSM RCEP, Exercise Physiologist;Cordelia Bessinger Oletta Darter, BA, ACSM CEP, Exercise Physiologist;Meredith Sherryll Burger, RN BSN   Supervising physician immediately available to respond to emergencies See telemetry face sheet for immediately available ER MD   Medication changes reported     No   Fall or balance concerns reported    No   Warm-up and Cool-down Performed on first and last piece of equipment   Resistance Training Performed Yes   VAD Patient? No     Pain Assessment   Currently in Pain? No/denies         History  Smoking Status  . Former Smoker  . Packs/day: 0.50  . Years: 10.00  . Types: Cigarettes  . Quit date: 09/29/1963  Smokeless Tobacco  . Never Used    Comment: Quit in his 20's  smoked maybe 10 years    Goals Met:  Independence with exercise equipment Exercise tolerated well No report of cardiac concerns or symptoms Strength training completed today  Goals Unmet:  Not Applicable  Comments: Pt able to follow exercise prescription today without complaint.  Will continue to monitor for progression.    Dr. Emily Filbert is Medical Director for [place] and [facility].

## 2017-06-22 DIAGNOSIS — Z48812 Encounter for surgical aftercare following surgery on the circulatory system: Secondary | ICD-10-CM | POA: Diagnosis not present

## 2017-06-22 DIAGNOSIS — I213 ST elevation (STEMI) myocardial infarction of unspecified site: Secondary | ICD-10-CM

## 2017-06-22 DIAGNOSIS — Z955 Presence of coronary angioplasty implant and graft: Secondary | ICD-10-CM

## 2017-06-22 NOTE — Progress Notes (Signed)
Daily Session Note  Patient Details  Name: Joseph Esparza MRN: [id number] Date of Birth: 11/02/1945 Referring Provider:     Cardiac Rehab from 05/10/2017 in [facility] Cardiac and Pulmonary Rehab  Referring Provider  Lujean Amel MD      Encounter Date: 06/22/2017  Check In:     Session Check In - 06/22/17 0829      Check-In   Location [facility]-Cardiac & Pulmonary Rehab   Staff Present Heath Lark, RN, BSN, CCRP;Jessica Luan Pulling, MA, ACSM RCEP, Exercise Physiologist;Vick Filter Oletta Darter, BA, ACSM CEP, Exercise Physiologist   Supervising physician immediately available to respond to emergencies See telemetry face sheet for immediately available ER MD   Medication changes reported     No   Fall or balance concerns reported    No   Warm-up and Cool-down Performed on first and last piece of equipment   Resistance Training Performed Yes   VAD Patient? No     Pain Assessment   Currently in Pain? No/denies         History  Smoking Status  . Former Smoker  . Packs/day: 0.50  . Years: 10.00  . Types: Cigarettes  . Quit date: 09/29/1963  Smokeless Tobacco  . Never Used    Comment: Quit in his 20's  smoked maybe 10 years    Goals Met:  Independence with exercise equipment Exercise tolerated well No report of cardiac concerns or symptoms Strength training completed today  Goals Unmet:  Not Applicable  Comments: Pt able to follow exercise prescription today without complaint.  Will continue to monitor for progression.    Dr. Emily Filbert is Medical Director for [place] and [facility].

## 2017-06-24 DIAGNOSIS — I213 ST elevation (STEMI) myocardial infarction of unspecified site: Secondary | ICD-10-CM

## 2017-06-24 DIAGNOSIS — Z955 Presence of coronary angioplasty implant and graft: Secondary | ICD-10-CM

## 2017-06-24 DIAGNOSIS — Z48812 Encounter for surgical aftercare following surgery on the circulatory system: Secondary | ICD-10-CM | POA: Diagnosis not present

## 2017-06-24 NOTE — Progress Notes (Signed)
Daily Session Note  Patient Details  Name: Joseph Esparza MRN: [id number] Date of Birth: [birth year] Referring Provider:     Cardiac Rehab from 05/10/2017 in [facility] Cardiac and Pulmonary Rehab  Referring Provider  Lujean Amel MD      Encounter Date: 06/24/2017  Check In:     Session Check In - 06/24/17 0903      Check-In   Location [facility]-Cardiac & Pulmonary Rehab   Staff Present Alberteen Sam, MA, ACSM RCEP, Exercise Physiologist;Sovereign Ramiro Oletta Darter, BA, ACSM CEP, Exercise Physiologist;Meredith Sherryll Burger, RN BSN   Supervising physician immediately available to respond to emergencies See telemetry face sheet for immediately available ER MD   Medication changes reported     No   Fall or balance concerns reported    No   Warm-up and Cool-down Performed on first and last piece of equipment   Resistance Training Performed Yes   VAD Patient? No     Pain Assessment   Currently in Pain? No/denies         History  Smoking Status  . Former Smoker  . Packs/day: 0.50  . Years: 10.00  . Types: Cigarettes  . Quit date: 09/29/1963  Smokeless Tobacco  . Never Used    Comment: Quit in his 20's  smoked maybe 10 years    Goals Met:  Independence with exercise equipment Exercise tolerated well No report of cardiac concerns or symptoms Strength training completed today  Goals Unmet:  Not Applicable  Comments: Pt able to follow exercise prescription today without complaint.  Will continue to monitor for progression.    Dr. Emily Filbert is Medical Director for [place] and [facility].

## 2017-06-29 ENCOUNTER — Encounter: Payer: Medicare Other | Attending: Internal Medicine

## 2017-06-29 DIAGNOSIS — I213 ST elevation (STEMI) myocardial infarction of unspecified site: Secondary | ICD-10-CM | POA: Diagnosis present

## 2017-06-29 DIAGNOSIS — Z48812 Encounter for surgical aftercare following surgery on the circulatory system: Secondary | ICD-10-CM | POA: Diagnosis present

## 2017-06-29 DIAGNOSIS — Z955 Presence of coronary angioplasty implant and graft: Secondary | ICD-10-CM

## 2017-06-29 NOTE — Progress Notes (Signed)
Daily Session Note  Patient Details  Name: Joseph Esparza MRN: [id number] Date of Birth: 08/09/1946 Referring Provider:     Cardiac Rehab from 05/10/2017 in [facility] Cardiac and Pulmonary Rehab  Referring Provider  Lujean Amel MD      Encounter Date: 06/29/2017  Check In:     Session Check In - 06/29/17 0819      Check-In   Location [facility]-Cardiac & Pulmonary Rehab   Staff Present Nyoka Cowden, RN, BSN, Willette Pa, MA, ACSM RCEP, Exercise Physiologist;Amanda Oletta Darter, [place], ACSM CEP, Exercise Physiologist   Supervising physician immediately available to respond to emergencies See telemetry face sheet for immediately available ER MD   Medication changes reported     No   Fall or balance concerns reported    No   Warm-up and Cool-down Performed on first and last piece of equipment   Resistance Training Performed Yes   VAD Patient? No     Pain Assessment   Currently in Pain? No/denies         History  Smoking Status  . Former Smoker  . Packs/day: 0.50  . Years: 10.00  . Types: Cigarettes  . Quit date: 09/29/1963  Smokeless Tobacco  . Never Used    Comment: Quit in his 20's  smoked maybe 10 years    Goals Met:  Independence with exercise equipment Exercise tolerated well No report of cardiac concerns or symptoms Strength training completed today  Goals Unmet:  Not Applicable  Comments: Pt able to follow exercise prescription today without complaint.  Will continue to monitor for progression.    Dr. Emily Filbert is Medical Director for [place] and [facility].

## 2017-07-01 DIAGNOSIS — Z48812 Encounter for surgical aftercare following surgery on the circulatory system: Secondary | ICD-10-CM | POA: Diagnosis not present

## 2017-07-01 DIAGNOSIS — Z955 Presence of coronary angioplasty implant and graft: Secondary | ICD-10-CM

## 2017-07-01 DIAGNOSIS — I213 ST elevation (STEMI) myocardial infarction of unspecified site: Secondary | ICD-10-CM

## 2017-07-01 NOTE — Progress Notes (Signed)
Daily Session Note  Patient Details  Name: Joseph Esparza MRN: [id number] Date of Birth: Feb 15, 1946 Referring Provider:     Cardiac Rehab from 05/10/2017 in [facility] Cardiac and Pulmonary Rehab  Referring Provider  Lujean Amel MD      Encounter Date: 07/01/2017  Check In:     Session Check In - 07/01/17 0821      Check-In   Location [facility]-Cardiac & Pulmonary Rehab   Staff Present Nada Maclachlan, BA, ACSM CEP, Exercise Physiologist;Jessica Luan Pulling, MA, ACSM RCEP, Exercise Physiologist;Meredith Sherryll Burger, RN BSN   Supervising physician immediately available to respond to emergencies See telemetry face sheet for immediately available ER MD   Medication changes reported     No   Fall or balance concerns reported    No   Warm-up and Cool-down Performed on first and last piece of equipment   Resistance Training Performed Yes   VAD Patient? No     Pain Assessment   Currently in Pain? No/denies         History  Smoking Status  . Former Smoker  . Packs/day: 0.50  . Years: 10.00  . Types: Cigarettes  . Quit date: 09/29/1963  Smokeless Tobacco  . Never Used    Comment: Quit in his 20's  smoked maybe 10 years    Goals Met:  Independence with exercise equipment Exercise tolerated well Strength training completed today  Goals Unmet:  Not Applicable  Comments: Pt able to follow exercise prescription today without complaint.  Will continue to monitor for progression.   Dr. Emily Filbert is Medical Director for [place] and [facility].

## 2017-07-06 ENCOUNTER — Encounter: Payer: Medicare Other | Admitting: *Deleted

## 2017-07-06 DIAGNOSIS — I213 ST elevation (STEMI) myocardial infarction of unspecified site: Secondary | ICD-10-CM

## 2017-07-06 DIAGNOSIS — Z48812 Encounter for surgical aftercare following surgery on the circulatory system: Secondary | ICD-10-CM | POA: Diagnosis not present

## 2017-07-06 DIAGNOSIS — Z955 Presence of coronary angioplasty implant and graft: Secondary | ICD-10-CM

## 2017-07-06 NOTE — Progress Notes (Signed)
Daily Session Note  Patient Details  Name: Joseph Esparza MRN: [id number] Date of Birth: 18-Sep-1946 Referring Provider:     Cardiac Rehab from 05/10/2017 in [facility] Cardiac and Pulmonary Rehab  Referring Provider  Lujean Amel MD      Encounter Date: 07/06/2017  Check In:     Session Check In - 07/06/17 0847      Check-In   Location [facility]-Cardiac & Pulmonary Rehab   Staff Present Alberteen Sam, MA, ACSM RCEP, Exercise Physiologist;Amanda Oletta Darter, BA, ACSM CEP, Exercise Physiologist;Carroll Enterkin, RN, BSN   Supervising physician immediately available to respond to emergencies See telemetry face sheet for immediately available ER MD   Medication changes reported     No   Fall or balance concerns reported    No   Warm-up and Cool-down Performed on first and last piece of equipment   Resistance Training Performed Yes   VAD Patient? No     Pain Assessment   Currently in Pain? No/denies   Multiple Pain Sites No         History  Smoking Status  . Former Smoker  . Packs/day: 0.50  . Years: 10.00  . Types: Cigarettes  . Quit date: 09/29/1963  Smokeless Tobacco  . Never Used    Comment: Quit in his 20's  smoked maybe 10 years    Goals Met:  Independence with exercise equipment Exercise tolerated well Personal goals reviewed No report of cardiac concerns or symptoms Strength training completed today  Goals Unmet:  Not Applicable  Comments: Pt able to follow exercise prescription today without complaint.  Will continue to monitor for progression.     [place] Name 05/10/17 1445 07/06/17 0851       6 Minute Walk   Phase Initial Discharge    Distance 1690 feet 2020 feet    Distance % Change  - 19.5 %    Distance Feet Change  - 330 ft    Walk Time 6 minutes 6 minutes    # of Rest Breaks 0 0    MPH 3.2 3.83    METS 3.8 4.09    RPE 15 15    VO2 Peak 13.31 14.31    Symptoms No No    Resting HR 62 bpm 59 bpm    Resting BP 144/70 106/50    Max Ex.  HR 111 bpm 90 bpm    Max Ex. BP 156/74 136/74    2 Minute Post BP 132/70  -          Dr. Emily Filbert is Medical Director for [place] and [facility].

## 2017-07-07 ENCOUNTER — Encounter: Payer: Self-pay | Admitting: *Deleted

## 2017-07-07 NOTE — Progress Notes (Signed)
Cardiac Individual Treatment Plan  Patient Details  Name: Joseph Esparza MRN: [id number] Date of Birth: 1945-11-10 Referring Provider:     Cardiac Rehab from 05/10/2017 in [facility] Cardiac and Pulmonary Rehab  Referring Provider  Lujean Amel MD      Initial Encounter Date:    Cardiac Rehab from 05/10/2017 in [facility] Cardiac and Pulmonary Rehab  Date  05/10/17  Referring Provider  Lujean Amel MD      Visit Diagnosis: No diagnosis found.  Patient's Home Medications on Admission:  Current Outpatient Prescriptions:  .  aspirin 81 MG chewable tablet, Chew 1 tablet (81 mg total) by mouth daily., Disp: 30 tablet, Rfl: 3 .  atorvastatin (LIPITOR) 80 MG tablet, Take 1 tablet (80 mg total) by mouth daily at 6 PM., Disp: 30 tablet, Rfl: 3 .  clopidogrel (PLAVIX) 75 MG tablet, Take 1 tablet (75 mg total) by mouth daily with breakfast., Disp: 30 tablet, Rfl: 3 .  lisinopril (PRINIVIL,ZESTRIL) 5 MG tablet, Take 1 tablet (5 mg total) by mouth 2 (two) times daily., Disp: 30 tablet, Rfl: 3 .  metoprolol tartrate (LOPRESSOR) 25 MG tablet, Take 0.5 tablets (12.5 mg total) by mouth 2 (two) times daily., Disp: 30 tablet, Rfl: 3  Past Medical History: No past medical history on file.  Tobacco Use: History  Smoking Status  . Former Smoker  . Packs/day: 0.50  . Years: 10.00  . Types: Cigarettes  . Quit date: 09/29/1963  Smokeless Tobacco  . Never Used    Comment: Quit in his 20's  smoked maybe 10 years    Labs: Recent Review Flowsheet Data    Labs for ITP Cardiac and Pulmonary Rehab Latest Ref Rng & Units 05/01/2017 05/02/2017   Cholestrol 0 - 200 mg/dL 239(H) -   LDLCALC 0 - 99 mg/dL 175(H) -   HDL >40 mg/dL 36(L) -   Trlycerides <150 mg/dL 141 -   Hemoglobin A1c 4.8 - 5.6 % - 5.8(H)       Exercise Target Goals:    Exercise Program Goal: Individual exercise prescription set with THRR, safety & activity barriers. Participant demonstrates ability to understand and report RPE using BORG  scale, to self-measure pulse accurately, and to acknowledge the importance of the exercise prescription.  Exercise Prescription Goal: Starting with aerobic activity 30 plus minutes a day, 3 days per week for initial exercise prescription. Provide home exercise prescription and guidelines that participant acknowledges understanding prior to discharge.  Activity Barriers & Risk Stratification:     Activity Barriers & Cardiac Risk Stratification - 05/10/17 1413      Activity Barriers & Cardiac Risk Stratification   Activity Barriers Joint Problems;Deconditioning;Balance Concerns  R knee occasionaly feels weak   Cardiac Risk Stratification High      6 Minute Walk:     6 Minute Walk    Row Name 05/10/17 1445 07/06/17 0851       6 Minute Walk   Phase Initial Discharge    Distance 1690 feet 2020 feet    Distance % Change  - 19.5 %    Distance Feet Change  - 330 ft    Walk Time 6 minutes 6 minutes    # of Rest Breaks 0 0    MPH 3.2 3.83    METS 3.8 4.09    RPE 15 15    VO2 Peak 13.31 14.31    Symptoms No No    Resting HR 62 bpm 59 bpm    Resting BP 144/70 106/50  Max Ex. HR 111 bpm 90 bpm    Max Ex. BP 156/74 136/74    2 Minute Post BP 132/70  -       Oxygen Initial Assessment:   Oxygen Re-Evaluation:   Oxygen Discharge (Final Oxygen Re-Evaluation):   Initial Exercise Prescription:     Initial Exercise Prescription - 05/10/17 1400      Date of Initial Exercise RX and Referring Provider   Date 05/10/17   Referring Provider Lujean Amel MD     Treadmill   MPH 3.2   Grade 0.5   Minutes 15   METs 3.67     Recumbant Elliptical   Level 2   RPM 50   Minutes 15   METs 3     T5 Nustep   Level 3   SPM 100   Minutes 15   METs 3     Prescription Details   Frequency (times per week) 2   Duration Progress to 45 minutes of aerobic exercise without signs/symptoms of physical distress     Intensity   THRR 40-80% of Max Heartrate 96-130   Ratings of  Perceived Exertion 11-13   Perceived Dyspnea 0-4     Progression   Progression Continue to progress workloads to maintain intensity without signs/symptoms of physical distress.     Resistance Training   Training Prescription Yes   Weight 4 lbs      Perform Capillary Blood Glucose checks as needed.  Exercise Prescription Changes:     Exercise Prescription Changes    Row Name 05/10/17 1400 05/19/17 1400 06/01/17 1500 06/15/17 1300 07/02/17 0900     Response to Exercise   Blood Pressure (Admit) 144/70 100/60 104/64 124/60 104/62   Blood Pressure (Exercise) 156/74 134/64 148/66 130/68 172/64   Blood Pressure (Exit) 132/70 124/70 102/60 94/52 122/72   Heart Rate (Admit) 62 bpm 63 bpm 76 bpm 56 bpm 77 bpm   Heart Rate (Exercise) 111 bpm 106 bpm 119 bpm 123 bpm 120 bpm   Heart Rate (Exit) 67 bpm 77 bpm 63 bpm 59 bpm 63 bpm   Oxygen Saturation (Admit) 97 %  -  -  -  -   Oxygen Saturation (Exercise) 97 %  -  -  -  -   Rating of Perceived Exertion (Exercise) '15 13 15 14 15   '$ Symptoms none none none none none   Comments walk test results second full day of exercise  -  -  -   Duration  - Progress to 45 minutes of aerobic exercise without signs/symptoms of physical distress Continue with 45 min of aerobic exercise without signs/symptoms of physical distress. Continue with 45 min of aerobic exercise without signs/symptoms of physical distress. Continue with 45 min of aerobic exercise without signs/symptoms of physical distress.   Intensity  - THRR unchanged THRR unchanged THRR unchanged THRR unchanged     Progression   Progression  - Continue to progress workloads to maintain intensity without signs/symptoms of physical distress. Continue to progress workloads to maintain intensity without signs/symptoms of physical distress. Continue to progress workloads to maintain intensity without signs/symptoms of physical distress. Continue to progress workloads to maintain intensity without  signs/symptoms of physical distress.   Average METs  - 2.62 3.02 3.44 3.34     Resistance Training   Training Prescription  - Yes Yes Yes Yes   Weight  - 3 lbs 3 lbs 4 lbs 4 lbs   Reps  - 10-15 10-15 10-15 10-15  Interval Training   Interval Training  - No No No No     Treadmill   MPH  - 3.2 3.2 3.5 3.5   Grade  - 0.5 0.5 0.5 0.5   Minutes  - '15 15 15 15   '$ METs  - 3.67 3.67 3.92 3.92     Recumbant Elliptical   Level  - '2 2 2 2   '$ Minutes  - '15 15 15 15   '$ METs  - 1.9 2.5 3.3 3.2     T5 Nustep   Level  - '3 3 3 3   '$ Minutes  - '15 15 15 15   '$ METs  - 2.3 2.9 3.1 2.9     Home Exercise Plan   Plans to continue exercise at  -  - Home (comment)  walking Home (comment)  walking Home (comment)  walking   Frequency  -  - Add 3 additional days to program exercise sessions. Add 3 additional days to program exercise sessions. Add 3 additional days to program exercise sessions.   Initial Home Exercises Provided  -  - 06/01/17 06/01/17 06/01/17      Exercise Comments:     Exercise Comments    Row Name 05/13/17 0907           Exercise Comments  First full day of exercise!  Patient was oriented to gym and equipment including functions, settings, policies, and procedures.  Patient's individual exercise prescription and treatment plan were reviewed.  All starting workloads were established based on the results of the 6 minute walk test done at initial orientation visit.  The plan for exercise progression was also introduced and progression will be customized based on patient's performance and goals.          Exercise Goals and Review:     Exercise Goals    Row Name 05/10/17 1449             Exercise Goals   Increase Physical Activity Yes       Intervention Provide advice, education, support and counseling about physical activity/exercise needs.;Develop an individualized exercise prescription for aerobic and resistive training based on initial evaluation findings, risk  stratification, comorbidities and participant's personal goals.       Expected Outcomes Achievement of increased cardiorespiratory fitness and enhanced flexibility, muscular endurance and strength shown through measurements of functional capacity and personal statement of participant.       Increase Strength and Stamina Yes       Intervention Provide advice, education, support and counseling about physical activity/exercise needs.;Develop an individualized exercise prescription for aerobic and resistive training based on initial evaluation findings, risk stratification, comorbidities and participant's personal goals.       Expected Outcomes Achievement of increased cardiorespiratory fitness and enhanced flexibility, muscular endurance and strength shown through measurements of functional capacity and personal statement of participant.          Exercise Goals Re-Evaluation :     Exercise Goals Re-Evaluation    Row Name 05/19/17 1441 05/25/17 1105 06/01/17 0901 06/01/17 1444 06/15/17 1303     Exercise Goal Re-Evaluation   Exercise Goals Review Increase Physical Activity;Increase Strenth and Stamina Increase Physical Activity;Understanding of Exercise Prescription Increase Physical Activity;Increase Strength and Stamina;Able to understand and use rate of perceived exertion (RPE) scale;Knowledge and understanding of Target Heart Rate Range (THRR);Able to check pulse independently;Understanding of Exercise Prescription Increase Physical Activity;Increase Strength and Stamina Increase Physical Activity;Increase Strength and Stamina   Comments Keyon is off  to a good start in rehab.  He has completed two full days of exercise.  He seems to want to work hard while here in class.  He had a little difficulty at first on the treadmill, but did better as he got more comfortable on the treadmill. We will continue to monitor his progression.  Purl has added 3 days per week of walking to his exercise routine.  He  is motivated to participate and work towards goals.  he has not yet added levels to his machines in HT.  Reviewed home exercise with pt today.  Pt plans to increase walking for exercise.  Reviewed THR, pulse, RPE, sign and symptoms, NTG use, and when to call 911 or MD.  Also discussed weather considerations and indoor options.  Pt voiced understanding. Yogi has been doing well in rehab.  He already walks 30 min on his off and will increase to 45 min. He has gotten up to 2.9 METs on the NuStep.  We will continue to monitor his progression.  Keenan continues to do well in rehab.  He is up to 3.5 mph on treadmill!  We will continue to monitor his progression.    Expected Outcomes Short: Increase workload on the XR and review home exercise guidelines.  Long: Start to make exercise part of his routine.  Short - continue to. progress workloads and walking.  Long - Exercise will become a regular routine Short - Deanthony will increase walk time to 45 min.  Long - Shante will maintain exercise on his own. Short: Increase home exercsie time and increase workloads.  Long: Continue to exercise independently.  Short:  Increase workload on REL.  Long: Continue to improve strength and stamina,    Row Name 06/22/17 0942 07/02/17 0931 07/06/17 0852         Exercise Goal Re-Evaluation   Exercise Goals Review Increase Physical Activity Increase Physical Activity;Increase Strength and Stamina Increase Physical Activity;Increase Strength and Stamina     Comments Alexxander is walking at home 2-3 days per week .  He has increased his time to 35 minutes walking. Lonza continues to do well in rehab. He is up to 3.2 METs on the NuStep.  We will continue to monitor his progression. Cuinn improved his walk test by 19.5%!!     Expected Outcomes Short - Yair will continue to exercise at home outside of his exercise here.  Long - Aldo will exercise independently. Short: Increase some of his workloads despite his higher RPEs.  Long: Continue to  exercise independently.   -        Discharge Exercise Prescription (Final Exercise Prescription Changes):     Exercise Prescription Changes - 07/02/17 0900      Response to Exercise   Blood Pressure (Admit) 104/62   Blood Pressure (Exercise) 172/64   Blood Pressure (Exit) 122/72   Heart Rate (Admit) 77 bpm   Heart Rate (Exercise) 120 bpm   Heart Rate (Exit) 63 bpm   Rating of Perceived Exertion (Exercise) 15   Symptoms none   Duration Continue with 45 min of aerobic exercise without signs/symptoms of physical distress.   Intensity THRR unchanged     Progression   Progression Continue to progress workloads to maintain intensity without signs/symptoms of physical distress.   Average METs 3.34     Resistance Training   Training Prescription Yes   Weight 4 lbs   Reps 10-15     Interval Training   Interval Training No  Treadmill   MPH 3.5   Grade 0.5   Minutes 15   METs 3.92     Recumbant Elliptical   Level 2   Minutes 15   METs 3.2     T5 Nustep   Level 3   Minutes 15   METs 2.9     Home Exercise Plan   Plans to continue exercise at Home (comment)  walking   Frequency Add 3 additional days to program exercise sessions.   Initial Home Exercises Provided 06/01/17      Nutrition:  Target Goals: Understanding of nutrition guidelines, daily intake of sodium '1500mg'$ , cholesterol '200mg'$ , calories 30% from fat and 7% or less from saturated fats, daily to have 5 or more servings of fruits and vegetables.  Biometrics:     Pre Biometrics - 05/10/17 1449      Pre Biometrics   Height 5' 8.6" (1.742 m)   Weight 179 lb 12.8 oz (81.6 kg)   Waist Circumference 38 inches   Hip Circumference 39 inches   Waist to Hip Ratio 0.97 %   BMI (Calculated) 26.9   Single Leg Stand 1.35 seconds       Nutrition Therapy Plan and Nutrition Goals:     Nutrition Therapy & Goals - 06/09/17 1157      Nutrition Therapy   RD appointment defered Yes      Nutrition  Discharge: Rate Your Plate Scores:     Nutrition Assessments - 05/10/17 1423      MEDFICTS Scores   Pre Score 27      Nutrition Goals Re-Evaluation:     Nutrition Goals Re-Evaluation    Row Name 06/09/17 1200 06/22/17 0941           Goals   Current Weight  - 172 lb 8 oz (78.2 kg)      Nutrition Goal Cont to eat healthy. Colyn eats very little meat. Walden eats mostly vegetarian.  He has lost 2-3 lb.        Expected Outcome Heart healthy eating to cont. it.  Short - Lindbergh will continue to eat heart healthy.  Long - Dreux will maintain habits long term.         Nutrition Goals Discharge (Final Nutrition Goals Re-Evaluation):     Nutrition Goals Re-Evaluation - 06/22/17 0941      Goals   Current Weight 172 lb 8 oz (78.2 kg)   Nutrition Goal Dondre eats mostly vegetarian.  He has lost 2-3 lb.     Expected Outcome Short - Siddhant will continue to eat heart healthy.  Long - Maciej will maintain habits long term.      Psychosocial: Target Goals: Acknowledge presence or absence of significant depression and/or stress, maximize coping skills, provide positive support system. Participant is able to verbalize types and ability to use techniques and skills needed for reducing stress and depression.   Initial Review & Psychosocial Screening:     Initial Psych Review & Screening - 05/10/17 1429      Initial Review   Current issues with None Identified     Family Dynamics   Good Support System? Yes  Friends   Comments Spouse does not live with Lanny Hurst     Barriers   Psychosocial barriers to participate in program There are no identifiable barriers or psychosocial needs.;The patient should benefit from training in stress management and relaxation.     Screening Interventions   Interventions Encouraged to exercise;To provide support and resources with identified  psychosocial needs;Provide feedback about the scores to participant      Quality of Life Scores:      Quality of  Life - 05/10/17 1429      Quality of Life Scores   Health/Function Pre 24.43 %   Socioeconomic Pre 23.5 %   Psych/Spiritual Pre 21.88 %   Family Pre 23.7 %   GLOBAL Pre 23.61 %      PHQ-9: Recent Review Flowsheet Data    Depression screen [facility] 2/9 05/10/2017   Decreased Interest 0   Down, Depressed, Hopeless 0   PHQ - 2 Score 0   Altered sleeping 0   Tired, decreased energy 1   Change in appetite 0   Feeling bad or failure about yourself  0   Trouble concentrating 0   Moving slowly or fidgety/restless 0   Suicidal thoughts 0   PHQ-9 Score 1   Difficult doing work/chores Not difficult at all     Interpretation of Total Score  Total Score Depression Severity:  1-4 = Minimal depression, 5-9 = Mild depression, 10-14 = Moderate depression, 15-19 = Moderately severe depression, 20-27 = Severe depression   Psychosocial Evaluation and Intervention:     Psychosocial Evaluation - 06/10/17 0934      Psychosocial Evaluation & Interventions   Interventions Encouraged to exercise with the program and follow exercise prescription   Comments Counselor met with Mr. Rabinovich [place]) today for initial psychosocial evaluation.  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  Mccade has a strong support system with a spouse (who lives separately); (2) daughters who are local and a good friend who checks in frequently.  Grover has some hearing issues in addition to his heart problems.  Helaman states he sleeps fine and has a good appetite.  He denies a history of depression or anxiety or any current symptoms.  He reports generally being in a positive mood and other than his health has minimal stress in his life.  Eleno has goals to get back to running again upon completion of this program.  He has not done this in the past 4-5 years and hopes that will change as he gets his stamina and strength back.  Staff will follow with Erick throughout the course of this program.     Expected  Outcomes Ashar will benefit from consistent exercise to achieve his stated goals.  The educational and psychoeducational components of this program will be helpful in understanding and managing his health more positively.    Continue Psychosocial Services  Follow up required by staff      Psychosocial Re-Evaluation:     Psychosocial Re-Evaluation    [place] Name 06/09/17 1200 06/09/17 1201 06/09/17 1202         Psychosocial Re-Evaluation   Current issues with None Identified  -  -     Comments Spouse does not live with him was reported before. PHQ9 score which scores for depression was very good at 0.  His quality of life scores were very good also.      Interventions Encouraged to attend Cardiac Rehabilitation for the exercise  -  -     Continue Psychosocial Services  Follow up required by staff  -  -        Psychosocial Discharge (Final Psychosocial Re-Evaluation):     Psychosocial Re-Evaluation - 06/09/17 1202      Psychosocial Re-Evaluation   Comments His quality of life scores were  very good also.       Vocational Rehabilitation: Provide vocational rehab assistance to qualifying candidates.   Vocational Rehab Evaluation & Intervention:     Vocational Rehab - 05/10/17 1432      Initial Vocational Rehab Evaluation & Intervention   Assessment shows need for Vocational Rehabilitation No      Education: Education Goals: Education classes will be provided on a variety of topics geared toward better understanding of heart health and risk factor modification. Participant will state understanding/return demonstration of topics presented as noted by education test scores.  Learning Barriers/Preferences:     Learning Barriers/Preferences - 05/10/17 1431      Learning Barriers/Preferences   Learning Barriers None   Learning Preferences None      Education Topics: General Nutrition Guidelines/Fats and Fiber: -Group instruction provided by verbal, written material,  models and posters to present the general guidelines for heart healthy nutrition. Gives an explanation and review of dietary fats and fiber.   Cardiac Rehab from 07/06/2017 in [facility] Cardiac and Pulmonary Rehab  Date  05/18/17  Educator  CR  Instruction Review Code  1- Verbalizes Understanding      Controlling Sodium/Reading Food Labels: -Group verbal and written material supporting the discussion of sodium use in heart healthy nutrition. Review and explanation with models, verbal and written materials for utilization of the food label.   Cardiac Rehab from 07/06/2017 in [facility] Cardiac and Pulmonary Rehab  Date  05/25/17  Educator  PI  Instruction Review Code  1- Verbalizes Understanding      Exercise Physiology & Risk Factors: - Group verbal and written instruction with models to review the exercise physiology of the cardiovascular system and associated critical values. Details cardiovascular disease risk factors and the goals associated with each risk factor.   Cardiac Rehab from 07/06/2017 in [facility] Cardiac and Pulmonary Rehab  Date  06/03/17  Educator  [place] [facility]  Instruction Review Code  1- Verbalizes Understanding      Aerobic Exercise & Resistance Training: - Gives group verbal and written discussion on the health impact of inactivity. On the components of aerobic and resistive training programs and the benefits of this training and how to safely progress through these programs.   Cardiac Rehab from 07/06/2017 in [facility] Cardiac and Pulmonary Rehab  Date  06/08/17  Educator  [facility]  Instruction Review Code  1- Verbalizes Understanding      Flexibility, Balance, General Exercise Guidelines: - Provides group verbal and written instruction on the benefits of flexibility and balance training programs. Provides general exercise guidelines with specific guidelines to those with heart or lung disease. Demonstration and skill practice provided.   Cardiac Rehab from 07/06/2017 in [facility] Cardiac and Pulmonary  Rehab  Date  06/10/17  Educator  AS  Instruction Review Code  1- Verbalizes Understanding      Stress Management: - Provides group verbal and written instruction about the health risks of elevated stress, cause of high stress, and healthy ways to reduce stress.   Cardiac Rehab from 07/06/2017 in [facility] Cardiac and Pulmonary Rehab  Date  06/15/17  Educator  [facility]  Instruction Review Code  1- Verbalizes Understanding      Depression: - Provides group verbal and written instruction on the correlation between heart/lung disease and depressed mood, treatment options, and the stigmas associated with seeking treatment.   Cardiac Rehab from 07/06/2017 in [facility] Cardiac and Pulmonary Rehab  Date  07/06/17  Educator  [facility]  Instruction Review Code (retired)  2-  meets goals/outcomes  Instruction Review Code  1- Verbalizes Understanding      Anatomy & Physiology of the Heart: - Group verbal and written instruction and models provide basic cardiac anatomy and physiology, with the coronary electrical and arterial systems. Review of: AMI, Angina, Valve disease, Heart Failure, Cardiac Arrhythmia, Pacemakers, and the ICD.   Cardiac Rehab from 07/06/2017 in [facility] Cardiac and Pulmonary Rehab  Date  06/17/17  Educator  [facility]  Instruction Review Code  1- Verbalizes Understanding      Cardiac Procedures: - Group verbal and written instruction to review commonly prescribed medications for heart disease. Reviews the medication, class of the drug, and side effects. Includes the steps to properly store meds and maintain the prescription regimen. (beta blockers and nitrates)   Cardiac Rehab from 07/06/2017 in [facility] Cardiac and Pulmonary Rehab  Date  06/22/17  Educator  SB  Instruction Review Code  1- Verbalizes Understanding      Cardiac Medications I: - Group verbal and written instruction to review commonly prescribed medications for heart disease. Reviews the medication, class of the drug, and side effects.  Includes the steps to properly store meds and maintain the prescription regimen.   Cardiac Rehab from 07/06/2017 in [facility] Cardiac and Pulmonary Rehab  Date  06/29/17  Educator  MA  Instruction Review Code  1- Verbalizes Understanding      Cardiac Medications II: -Group verbal and written instruction to review commonly prescribed medications for heart disease. Reviews the medication, class of the drug, and side effects. (all other drug classes)    Go Sex-Intimacy & Heart Disease, Get SMART - Goal Setting: - Group verbal and written instruction through game format to discuss heart disease and the return to sexual intimacy. Provides group verbal and written material to discuss and apply goal setting through the application of the S.M.A.R.T. Method.   Cardiac Rehab from 07/06/2017 in [facility] Cardiac and Pulmonary Rehab  Date  06/22/17  Educator  SB  Instruction Review Code  1- Verbalizes Understanding      Other Matters of the Heart: - Provides group verbal, written materials and models to describe Heart Failure, Angina, Valve Disease, Peripheral Artery Disease, and Diabetes in the realm of heart disease. Includes description of the disease process and treatment options available to the cardiac patient.   Cardiac Rehab from 07/06/2017 in [facility] Cardiac and Pulmonary Rehab  Date  06/17/17  Educator  [facility]  Instruction Review Code  1- Verbalizes Understanding      Exercise & Equipment Safety: - Individual verbal instruction and demonstration of equipment use and safety with use of the equipment.   Cardiac Rehab from 07/06/2017 in [facility] Cardiac and Pulmonary Rehab  Date  05/10/17  Educator  Sb      Infection Prevention: - Provides verbal and written material to individual with discussion of infection control including proper hand washing and proper equipment cleaning during exercise session.   Cardiac Rehab from 07/06/2017 in [facility] Cardiac and Pulmonary Rehab  Date  05/10/17  Educator  SB       Falls Prevention: - Provides verbal and written material to individual with discussion of falls prevention and safety.   Cardiac Rehab from 07/06/2017 in [facility] Cardiac and Pulmonary Rehab  Date  05/10/17  Educator  Sb  Instruction Review Code (retired)  2- meets goals/outcomes      Diabetes: - Individual verbal and written instruction to review signs/symptoms of diabetes, desired ranges of glucose level fasting, after meals and with exercise.  Acknowledge that pre and post exercise glucose checks will be done for 3 sessions at entry of program.   Other: -Provides group and verbal instruction on various topics (see comments)    Knowledge Questionnaire Score:     Knowledge Questionnaire Score - 05/10/17 1431      Knowledge Questionnaire Score   Pre Score 27/28  Reviewed correct response with Lanny Hurst. He verbalized understanding of correct response.      Core Components/Risk Factors/Patient Goals at Admission:     Personal Goals and Risk Factors at Admission - 05/10/17 1427      Core Components/Risk Factors/Patient Goals on Admission    Weight Management Yes;Weight Loss   Intervention Weight Management: Develop a combined nutrition and exercise program designed to reach desired caloric intake, while maintaining appropriate intake of nutrient and fiber, sodium and fats, and appropriate energy expenditure required for the weight goal.;Weight Management/Obesity: Establish reasonable short term and long term weight goals.;Obesity: Provide education and appropriate resources to help participant work on and attain dietary goals.;Weight Management: Provide education and appropriate resources to help participant work on and attain dietary goals.  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   Admit Weight 179 lb 12.8 oz (81.6 kg)   Goal Weight: Short Term 177 lb (80.3 kg)   Goal Weight: Long Term 155 lb (70.3 kg)   Expected Outcomes Long Term:  Adherence to nutrition and physical activity/exercise program aimed toward attainment of established weight goal;Short Term: Continue to assess and modify interventions until short term weight is achieved;Weight Loss: Understanding of general recommendations for a balanced deficit meal plan, which promotes 1-2 lb weight loss per week and includes a negative energy balance of [phone] kcal/d   Hypertension Yes   Intervention Provide education on lifestyle modifcations including regular physical activity/exercise, weight management, moderate sodium restriction and increased consumption of fresh fruit, vegetables, and low fat dairy, alcohol moderation, and smoking cessation.;Monitor prescription use compliance.   Expected Outcomes Short Term: Continued assessment and intervention until BP is < 140/38m HG in hypertensive participants. < 130/830mHG in hypertensive participants with diabetes, heart failure or chronic kidney disease.;Long Term: Maintenance of blood pressure at goal levels.   Lipids Yes   Intervention Provide education and support for participant on nutrition & aerobic/resistive exercise along with prescribed medications to achieve LDL '70mg'$ , HDL >'40mg'$ .   Expected Outcomes Short Term: Participant states understanding of desired cholesterol values and is compliant with medications prescribed. Participant is following exercise prescription and nutrition guidelines.;Long Term: Cholesterol controlled with medications as prescribed, with individualized exercise RX and with personalized nutrition plan. Value goals: LDL < '70mg'$ , HDL > 40 mg.      Core Components/Risk Factors/Patient Goals Review:      Goals and Risk Factor Review    Row Name 05/25/17 1100 06/22/17 0946           Core Components/Risk Factors/Patient Goals Review   Personal Goals Review Weight Management/Obesity;Hypertension Weight Management/Obesity;Hypertension      Review KeNegronoal is to lose 20 lb.  He rarely eats meat  and is keeping sodium below 1500 mg.  His BPs have been good when he attends HT.  He is also walking for 30 min on Friday Saturday and Sunday . KeMarkailaintains his heart healthy diet.  He has added time to his walking at home.  BP have been normal at HT.       Expected Outcomes Short - KeIssaicill  coontinue regular exercise and healthy dietary changes.  Long - Abdoulie will lose 1-2 lb per week. Short - Jasdeep will continue healthy lifestyle habits.  Long - Timoteo will maintain healthy habits after graduating cardiac rehab.         Core Components/Risk Factors/Patient Goals at Discharge (Final Review):      Goals and Risk Factor Review - 06/22/17 0946      Core Components/Risk Factors/Patient Goals Review   Personal Goals Review Weight Management/Obesity;Hypertension   Review Tiandre maintains his heart healthy diet.  He has added time to his walking at home.  BP have been normal at HT.    Expected Outcomes Short - Olivier will continue healthy lifestyle habits.  Long - Jessen will maintain healthy habits after graduating cardiac rehab.      ITP Comments:     ITP Comments    Row Name 05/10/17 1409 06/09/17 1105 06/09/17 1202 07/07/17 0634     ITP Comments Medical review completed today. Initial ITP created fro review, changes as needed and cosign by Dr Loleta Chance.  Documentation of diagnosis can be found in [facility] 05/01/2017 Admission 30 day review. Continue with ITP unless directed changes per Medical Director review.   His PHQ9 scores which evaluate for depression were very good at zero.  30 day Review. Continue with ITP unless directed changes per Medical Director Review.        Comments:

## 2017-07-08 ENCOUNTER — Encounter: Payer: Medicare Other | Admitting: *Deleted

## 2017-07-08 DIAGNOSIS — I213 ST elevation (STEMI) myocardial infarction of unspecified site: Secondary | ICD-10-CM

## 2017-07-08 DIAGNOSIS — Z955 Presence of coronary angioplasty implant and graft: Secondary | ICD-10-CM

## 2017-07-08 DIAGNOSIS — Z48812 Encounter for surgical aftercare following surgery on the circulatory system: Secondary | ICD-10-CM | POA: Diagnosis not present

## 2017-07-08 NOTE — Progress Notes (Signed)
Daily Session Note  Patient Details  Name: Joseph Esparza MRN: [id number] Date of Birth: 01/23/1946 Referring Provider:     Cardiac Rehab from 05/10/2017 in [facility] Cardiac and Pulmonary Rehab  Referring Provider  Callwood, Dwayne MD      Encounter Date: 07/08/2017  Check In:     Session Check In - 07/08/17 0914      Check-In   Location [facility]-Cardiac & Pulmonary Rehab   Staff Present  , MA, ACSM RCEP, Exercise Physiologist;Amanda Sommer, BA, ACSM CEP, Exercise Physiologist;Meredith Craven, RN BSN   Supervising physician immediately available to respond to emergencies See telemetry face sheet for immediately available ER MD   Medication changes reported     No   Fall or balance concerns reported    No   Warm-up and Cool-down Performed on first and last piece of equipment   Resistance Training Performed Yes   VAD Patient? No     Pain Assessment   Currently in Pain? No/denies         History  Smoking Status  . Former Smoker  . Packs/day: 0.50  . Years: 10.00  . Types: Cigarettes  . Quit date: 09/29/1963  Smokeless Tobacco  . Never Used    Comment: Quit in his 20's  smoked maybe 10 years    Goals Met:  Independence with exercise equipment Exercise tolerated well No report of cardiac concerns or symptoms Strength training completed today  Goals Unmet:  Not Applicable  Comments: Pt able to follow exercise prescription today without complaint.  Will continue to monitor for progression.    Dr. Mark Miller is Medical Director for [facility] and [facility]. 

## 2017-07-09 DIAGNOSIS — H90A12 Conductive hearing loss, unilateral, left ear with restricted hearing on the contralateral side: Secondary | ICD-10-CM | POA: Insufficient documentation

## 2017-07-11 DIAGNOSIS — R972 Elevated prostate specific antigen [PSA]: Secondary | ICD-10-CM | POA: Insufficient documentation

## 2017-07-13 ENCOUNTER — Encounter: Payer: Medicare Other | Admitting: *Deleted

## 2017-07-13 DIAGNOSIS — Z48812 Encounter for surgical aftercare following surgery on the circulatory system: Secondary | ICD-10-CM | POA: Diagnosis not present

## 2017-07-13 DIAGNOSIS — Z955 Presence of coronary angioplasty implant and graft: Secondary | ICD-10-CM

## 2017-07-13 DIAGNOSIS — I213 ST elevation (STEMI) myocardial infarction of unspecified site: Secondary | ICD-10-CM

## 2017-07-13 NOTE — Progress Notes (Signed)
Daily Session Note  Patient Details  Name: Joseph Esparza MRN: [id number] Date of Birth: 22-Sep-1946 Referring Provider:     Cardiac Rehab from 05/10/2017 in [facility] Cardiac and Pulmonary Rehab  Referring Provider  Lujean Amel MD      Encounter Date: 07/13/2017  Check In:     Session Check In - 07/13/17 0845      Check-In   Location [facility]-Cardiac & Pulmonary Rehab   Staff Present Renita Papa, RN BSN;Joseph Hood RCP,RRT,BSRT;[facility] RN,BSN   Supervising physician immediately available to respond to emergencies See telemetry face sheet for immediately available ER MD   Medication changes reported     No   Fall or balance concerns reported    No   Warm-up and Cool-down Performed on first and last piece of equipment   Resistance Training Performed Yes   VAD Patient? No     Pain Assessment   Currently in Pain? No/denies         History  Smoking Status  . Former Smoker  . Packs/day: 0.50  . Years: 10.00  . Types: Cigarettes  . Quit date: 09/29/1963  Smokeless Tobacco  . Never Used    Comment: Quit in his 20's  smoked maybe 10 years    Goals Met:  Independence with exercise equipment Exercise tolerated well No report of cardiac concerns or symptoms Strength training completed today  Goals Unmet:  Not Applicable  Comments: Pt able to follow exercise prescription today without complaint.  Will continue to monitor for progression.    Dr. Emily Filbert is Medical Director for [facility] and [facility].

## 2017-07-15 ENCOUNTER — Encounter: Payer: Medicare Other | Admitting: *Deleted

## 2017-07-15 DIAGNOSIS — Z955 Presence of coronary angioplasty implant and graft: Secondary | ICD-10-CM

## 2017-07-15 DIAGNOSIS — I213 ST elevation (STEMI) myocardial infarction of unspecified site: Secondary | ICD-10-CM

## 2017-07-15 DIAGNOSIS — Z48812 Encounter for surgical aftercare following surgery on the circulatory system: Secondary | ICD-10-CM | POA: Diagnosis not present

## 2017-07-15 NOTE — Progress Notes (Signed)
Daily Session Note  Patient Details  Name: Joseph Esparza MRN: [id number] Date of Birth: [birth year] Referring Provider:     Cardiac Rehab from 05/10/2017 in [facility] Cardiac and Pulmonary Rehab  Referring Provider  Lujean Amel MD      Encounter Date: 07/15/2017  Check In:     Session Check In - 07/15/17 0930      Check-In   Location [facility]-Cardiac & Pulmonary Rehab   Staff Present Nada Maclachlan, BA, ACSM CEP, Exercise Physiologist;Krista Frederico Hamman, RN BSN;Meredith Sherryll Burger, RN BSN   Supervising physician immediately available to respond to emergencies See telemetry face sheet for immediately available ER MD   Medication changes reported     No   Fall or balance concerns reported    No   Tobacco Cessation No Change   Warm-up and Cool-down Performed on first and last piece of equipment   Resistance Training Performed Yes   VAD Patient? No     Pain Assessment   Currently in Pain? No/denies   Multiple Pain Sites No         History  Smoking Status  . Former Smoker  . Packs/day: 0.50  . Years: 10.00  . Types: Cigarettes  . Quit date: 09/29/1963  Smokeless Tobacco  . Never Used    Comment: Quit in his 20's  smoked maybe 10 years    Goals Met:  Independence with exercise equipment Exercise tolerated well No report of cardiac concerns or symptoms Strength training completed today  Goals Unmet:  Not Applicable  Comments: Pt able to follow exercise prescription today without complaint.  Will continue to monitor for progression.    Dr. Emily Filbert is Medical Director for [place] and [facility].

## 2017-07-20 ENCOUNTER — Encounter: Payer: Medicare Other | Admitting: *Deleted

## 2017-07-20 DIAGNOSIS — Z48812 Encounter for surgical aftercare following surgery on the circulatory system: Secondary | ICD-10-CM | POA: Diagnosis not present

## 2017-07-20 DIAGNOSIS — Z955 Presence of coronary angioplasty implant and graft: Secondary | ICD-10-CM

## 2017-07-20 DIAGNOSIS — I213 ST elevation (STEMI) myocardial infarction of unspecified site: Secondary | ICD-10-CM

## 2017-07-20 NOTE — Progress Notes (Signed)
Cardiac Individual Treatment Plan  Patient Details  Name: Joseph Esparza MRN: [id number] Date of Birth: 1946/06/16 Referring Provider:     Cardiac Rehab from 05/10/2017 in [facility] Cardiac and Pulmonary Rehab  Referring Provider  Lujean Amel MD      Initial Encounter Date:    Cardiac Rehab from 05/10/2017 in [facility] Cardiac and Pulmonary Rehab  Date  05/10/17  Referring Provider  Lujean Amel MD      Visit Diagnosis: ST elevation myocardial infarction (STEMI), unspecified artery [facility])  Status post coronary artery stent placement  Patient's Home Medications on Admission:  Current Outpatient Prescriptions:  .  aspirin 81 MG chewable tablet, Chew 1 tablet (81 mg total) by mouth daily., Disp: 30 tablet, Rfl: 3 .  atorvastatin (LIPITOR) 80 MG tablet, Take 1 tablet (80 mg total) by mouth daily at 6 PM., Disp: 30 tablet, Rfl: 3 .  clopidogrel (PLAVIX) 75 MG tablet, Take 1 tablet (75 mg total) by mouth daily with breakfast., Disp: 30 tablet, Rfl: 3 .  lisinopril (PRINIVIL,ZESTRIL) 5 MG tablet, Take 1 tablet (5 mg total) by mouth 2 (two) times daily., Disp: 30 tablet, Rfl: 3 .  metoprolol tartrate (LOPRESSOR) 25 MG tablet, Take 0.5 tablets (12.5 mg total) by mouth 2 (two) times daily., Disp: 30 tablet, Rfl: 3  Past Medical History: No past medical history on file.  Tobacco Use: History  Smoking Status  . Former Smoker  . Packs/day: 0.50  . Years: 10.00  . Types: Cigarettes  . Quit date: 09/29/1963  Smokeless Tobacco  . Never Used    Comment: Quit in his 20's  smoked maybe 10 years    Labs: Recent Review Flowsheet Data    Labs for ITP Cardiac and Pulmonary Rehab Latest Ref Rng & Units 05/01/2017 05/02/2017   Cholestrol 0 - 200 mg/dL 239(H) -   LDLCALC 0 - 99 mg/dL 175(H) -   HDL >40 mg/dL 36(L) -   Trlycerides <150 mg/dL 141 -   Hemoglobin A1c 4.8 - 5.6 % - 5.8(H)       Exercise Target Goals:    Exercise Program Goal: Individual exercise prescription set with THRR, safety  & activity barriers. Participant demonstrates ability to understand and report RPE using BORG scale, to self-measure pulse accurately, and to acknowledge the importance of the exercise prescription.  Exercise Prescription Goal: Starting with aerobic activity 30 plus minutes a day, 3 days per week for initial exercise prescription. Provide home exercise prescription and guidelines that participant acknowledges understanding prior to discharge.  Activity Barriers & Risk Stratification:     Activity Barriers & Cardiac Risk Stratification - 05/10/17 1413      Activity Barriers & Cardiac Risk Stratification   Activity Barriers Joint Problems;Deconditioning;Balance Concerns  R knee occasionaly feels weak   Cardiac Risk Stratification High      6 Minute Walk:     6 Minute Walk    Row Name 05/10/17 1445 07/06/17 0851       6 Minute Walk   Phase Initial Discharge    Distance 1690 feet 2020 feet    Distance % Change  - 19.5 %    Distance Feet Change  - 330 ft    Walk Time 6 minutes 6 minutes    # of Rest Breaks 0 0    MPH 3.2 3.83    METS 3.8 4.09    RPE 15 15    VO2 Peak 13.31 14.31    Symptoms No No    Resting  HR 62 bpm 59 bpm    Resting BP 144/70 106/50    Max Ex. HR 111 bpm 90 bpm    Max Ex. BP 156/74 136/74    2 Minute Post BP 132/70  -       Oxygen Initial Assessment:   Oxygen Re-Evaluation:   Oxygen Discharge (Final Oxygen Re-Evaluation):   Initial Exercise Prescription:     Initial Exercise Prescription - 05/10/17 1400      Date of Initial Exercise RX and Referring Provider   Date 05/10/17   Referring Provider Lujean Amel MD     Treadmill   MPH 3.2   Grade 0.5   Minutes 15   METs 3.67     Recumbant Elliptical   Level 2   RPM 50   Minutes 15   METs 3     T5 Nustep   Level 3   SPM 100   Minutes 15   METs 3     Prescription Details   Frequency (times per week) 2   Duration Progress to 45 minutes of aerobic exercise without  signs/symptoms of physical distress     Intensity   THRR 40-80% of Max Heartrate 96-130   Ratings of Perceived Exertion 11-13   Perceived Dyspnea 0-4     Progression   Progression Continue to progress workloads to maintain intensity without signs/symptoms of physical distress.     Resistance Training   Training Prescription Yes   Weight 4 lbs      Perform Capillary Blood Glucose checks as needed.  Exercise Prescription Changes:      Exercise Prescription Changes    Row Name 05/10/17 1400 05/19/17 1400 06/01/17 1500 06/15/17 1300 07/02/17 0900     Response to Exercise   Blood Pressure (Admit) 144/70 100/60 104/64 124/60 104/62   Blood Pressure (Exercise) 156/74 134/64 148/66 130/68 172/64   Blood Pressure (Exit) 132/70 124/70 102/60 94/52 122/72   Heart Rate (Admit) 62 bpm 63 bpm 76 bpm 56 bpm 77 bpm   Heart Rate (Exercise) 111 bpm 106 bpm 119 bpm 123 bpm 120 bpm   Heart Rate (Exit) 67 bpm 77 bpm 63 bpm 59 bpm 63 bpm   Oxygen Saturation (Admit) 97 %  -  -  -  -   Oxygen Saturation (Exercise) 97 %  -  -  -  -   Rating of Perceived Exertion (Exercise) '15 13 15 14 15   '$ Symptoms none none none none none   Comments walk test results second full day of exercise  -  -  -   Duration  - Progress to 45 minutes of aerobic exercise without signs/symptoms of physical distress Continue with 45 min of aerobic exercise without signs/symptoms of physical distress. Continue with 45 min of aerobic exercise without signs/symptoms of physical distress. Continue with 45 min of aerobic exercise without signs/symptoms of physical distress.   Intensity  - THRR unchanged THRR unchanged THRR unchanged THRR unchanged     Progression   Progression  - Continue to progress workloads to maintain intensity without signs/symptoms of physical distress. Continue to progress workloads to maintain intensity without signs/symptoms of physical distress. Continue to progress workloads to maintain intensity without  signs/symptoms of physical distress. Continue to progress workloads to maintain intensity without signs/symptoms of physical distress.   Average METs  - 2.62 3.02 3.44 3.34     Resistance Training   Training Prescription  - Yes Yes Yes Yes   Weight  - 3 lbs  3 lbs 4 lbs 4 lbs   Reps  - 10-15 10-15 10-15 10-15     Interval Training   Interval Training  - No No No No     Treadmill   MPH  - 3.2 3.2 3.5 3.5   Grade  - 0.5 0.5 0.5 0.5   Minutes  - '15 15 15 15   '$ METs  - 3.67 3.67 3.92 3.92     Recumbant Elliptical   Level  - '2 2 2 2   '$ Minutes  - '15 15 15 15   '$ METs  - 1.9 2.5 3.3 3.2     T5 Nustep   Level  - '3 3 3 3   '$ Minutes  - '15 15 15 15   '$ METs  - 2.3 2.9 3.1 2.9     Home Exercise Plan   Plans to continue exercise at  -  - Home (comment)  walking Home (comment)  walking Home (comment)  walking   Frequency  -  - Add 3 additional days to program exercise sessions. Add 3 additional days to program exercise sessions. Add 3 additional days to program exercise sessions.   Initial Home Exercises Provided  -  - 06/01/17 06/01/17 06/01/17   Row Name 07/14/17 0900             Response to Exercise   Blood Pressure (Admit) 98/56       Blood Pressure (Exercise) 144/64       Blood Pressure (Exit) 98/52       Heart Rate (Admit) 70 bpm       Heart Rate (Exercise) 116 bpm       Heart Rate (Exit) 69 bpm       Rating of Perceived Exertion (Exercise) 15       Symptoms none       Duration Continue with 45 min of aerobic exercise without signs/symptoms of physical distress.       Intensity THRR unchanged         Progression   Progression Continue to progress workloads to maintain intensity without signs/symptoms of physical distress.       Average METs 3.78         Resistance Training   Training Prescription Yes       Weight 4 lb       Reps 10-15         Interval Training   Interval Training No         Treadmill   MPH 3.5       Grade 0.5       Minutes 15       METs 3.92          T5 Nustep   Level 3       Minutes 15       METs 3.4         Home Exercise Plan   Plans to continue exercise at Home (comment)  walking       Frequency Add 3 additional days to program exercise sessions.       Initial Home Exercises Provided 06/01/17          Exercise Comments:      Exercise Comments    Row Name 05/13/17 8453 07/20/17 0830         Exercise Comments  First full day of exercise!  Patient was oriented to gym and equipment including functions, settings, policies, and procedures.  Patient's individual exercise prescription and treatment  plan were reviewed.  All starting workloads were established based on the results of the 6 minute walk test done at initial orientation visit.  The plan for exercise progression was also introduced and progression will be customized based on patient's performance and goals. Joseph Esparza graduated today from cardiac rehab with 36 sessions completed.  Details of the patient's exercise prescription and what He needs to do in order to continue the prescription and progress were discussed with patient.  Patient was given a copy of prescription and goals.  Patient verbalized understanding.  Joseph Esparza plans to continue to exercise by walking at home.         Exercise Goals and Review:      Exercise Goals    Row Name 05/10/17 1449             Exercise Goals   Increase Physical Activity Yes       Intervention Provide advice, education, support and counseling about physical activity/exercise needs.;Develop an individualized exercise prescription for aerobic and resistive training based on initial evaluation findings, risk stratification, comorbidities and participant's personal goals.       Expected Outcomes Achievement of increased cardiorespiratory fitness and enhanced flexibility, muscular endurance and strength shown through measurements of functional capacity and personal statement of participant.       Increase Strength and Stamina Yes        Intervention Provide advice, education, support and counseling about physical activity/exercise needs.;Develop an individualized exercise prescription for aerobic and resistive training based on initial evaluation findings, risk stratification, comorbidities and participant's personal goals.       Expected Outcomes Achievement of increased cardiorespiratory fitness and enhanced flexibility, muscular endurance and strength shown through measurements of functional capacity and personal statement of participant.          Exercise Goals Re-Evaluation :     Exercise Goals Re-Evaluation    Row Name 05/19/17 1441 05/25/17 1105 06/01/17 0901 06/01/17 1444 06/15/17 1303     Exercise Goal Re-Evaluation   Exercise Goals Review Increase Physical Activity;Increase Strenth and Stamina Increase Physical Activity;Understanding of Exercise Prescription Increase Physical Activity;Increase Strength and Stamina;Able to understand and use rate of perceived exertion (RPE) scale;Knowledge and understanding of Target Heart Rate Range (THRR);Able to check pulse independently;Understanding of Exercise Prescription Increase Physical Activity;Increase Strength and Stamina Increase Physical Activity;Increase Strength and Stamina   Comments Wilbern is off to a good start in rehab.  He has completed two full days of exercise.  He seems to want to work hard while here in class.  He had a little difficulty at first on the treadmill, but did better as he got more comfortable on the treadmill. We will continue to monitor his progression.  Joseph Esparza has added 3 days per week of walking to his exercise routine.  He is motivated to participate and work towards goals.  he has not yet added levels to his machines in HT.  Reviewed home exercise with pt today.  Pt plans to increase walking for exercise.  Reviewed THR, pulse, RPE, sign and symptoms, NTG use, and when to call 911 or MD.  Also discussed weather considerations and indoor options.  Pt  voiced understanding. Joseph Esparza has been doing well in rehab.  He already walks 30 min on his off and will increase to 45 min. He has gotten up to 2.9 METs on the NuStep.  We will continue to monitor his progression.  Joseph Esparza continues to do well in rehab.  He is up to 3.5 mph  on treadmill!  We will continue to monitor his progression.    Expected Outcomes Short: Increase workload on the XR and review home exercise guidelines.  Long: Start to make exercise part of his routine.  Short - continue to. progress workloads and walking.  Long - Exercise will become a regular routine Short - Matvey will increase walk time to 45 min.  Long - Jadd will maintain exercise on his own. Short: Increase home exercsie time and increase workloads.  Long: Continue to exercise independently.  Short:  Increase workload on REL.  Long: Continue to improve strength and stamina,    Row Name 06/22/17 [phone] 07/02/17 0931 07/06/17 0852 07/14/17 0947       Exercise Goal Re-Evaluation   Exercise Goals Review Increase Physical Activity Increase Physical Activity;Increase Strength and Stamina Increase Physical Activity;Increase Strength and Stamina Increase Physical Activity;Increase Strength and Stamina;Able to understand and use rate of perceived exertion (RPE) scale    Comments Joseph Esparza is walking at home 2-3 days per week .  He has increased his time to 35 minutes walking. Joseph Esparza continues to do well in rehab. He is up to 3.2 METs on the NuStep.  We will continue to monitor his progression. Joseph Esparza improved his walk test by 19.5%!! Joseph Esparza is progressing well and has increased overall MET level.    Expected Outcomes Short - Duell will continue to exercise at home outside of his exercise here.  Long - Idan will exercise independently. Short: Increase some of his workloads despite his higher RPEs.  Long: Continue to exercise independently.   - Short - Giovan will finish HT.  Long - Khye will continue exercise on his own.       Discharge Exercise  Prescription (Final Exercise Prescription Changes):     Exercise Prescription Changes - 07/14/17 0900      Response to Exercise   Blood Pressure (Admit) 98/56   Blood Pressure (Exercise) 144/64   Blood Pressure (Exit) 98/52   Heart Rate (Admit) 70 bpm   Heart Rate (Exercise) 116 bpm   Heart Rate (Exit) 69 bpm   Rating of Perceived Exertion (Exercise) 15   Symptoms none   Duration Continue with 45 min of aerobic exercise without signs/symptoms of physical distress.   Intensity THRR unchanged     Progression   Progression Continue to progress workloads to maintain intensity without signs/symptoms of physical distress.   Average METs 3.78     Resistance Training   Training Prescription Yes   Weight 4 lb   Reps 10-15     Interval Training   Interval Training No     Treadmill   MPH 3.5   Grade 0.5   Minutes 15   METs 3.92     T5 Nustep   Level 3   Minutes 15   METs 3.4     Home Exercise Plan   Plans to continue exercise at Home (comment)  walking   Frequency Add 3 additional days to program exercise sessions.   Initial Home Exercises Provided 06/01/17      Nutrition:  Target Goals: Understanding of nutrition guidelines, daily intake of sodium '1500mg'$ , cholesterol '200mg'$ , calories 30% from fat and 7% or less from saturated fats, daily to have 5 or more servings of fruits and vegetables.  Biometrics:     Pre Biometrics - 05/10/17 1449      Pre Biometrics   Height 5' 8.6" (1.742 m)   Weight 179 lb 12.8 oz (81.6 kg)   Waist  Circumference 38 inches   Hip Circumference 39 inches   Waist to Hip Ratio 0.97 %   BMI (Calculated) 26.9   Single Leg Stand 1.35 seconds       Nutrition Therapy Plan and Nutrition Goals:     Nutrition Therapy & Goals - 06/09/17 1157      Nutrition Therapy   RD appointment defered Yes      Nutrition Discharge: Rate Your Plate Scores:     Nutrition Assessments - 07/15/17 1013      MEDFICTS Scores   Post Score 13       Nutrition Goals Re-Evaluation:     Nutrition Goals Re-Evaluation    Row Name 06/09/17 1200 06/22/17 0941           Goals   Current Weight  - 172 lb 8 oz (78.2 kg)      Nutrition Goal Cont to eat healthy. Joseph Esparza eats very little meat. Joseph Esparza eats mostly vegetarian.  He has lost 2-3 lb.        Expected Outcome Heart healthy eating to cont. it.  Short - Joseph Esparza will continue to eat heart healthy.  Long - Joseph Esparza will maintain habits long term.         Nutrition Goals Discharge (Final Nutrition Goals Re-Evaluation):     Nutrition Goals Re-Evaluation - 06/22/17 0941      Goals   Current Weight 172 lb 8 oz (78.2 kg)   Nutrition Goal Kelin eats mostly vegetarian.  He has lost 2-3 lb.     Expected Outcome Short - Joseph Esparza will continue to eat heart healthy.  Long - Joseph Esparza will maintain habits long term.      Psychosocial: Target Goals: Acknowledge presence or absence of significant depression and/or stress, maximize coping skills, provide positive support system. Participant is able to verbalize types and ability to use techniques and skills needed for reducing stress and depression.   Initial Review & Psychosocial Screening:     Initial Psych Review & Screening - 05/10/17 1429      Initial Review   Current issues with None Identified     Family Dynamics   Good Support System? Yes  Friends   Comments Spouse does not live with Lanny Hurst     Barriers   Psychosocial barriers to participate in program There are no identifiable barriers or psychosocial needs.;The patient should benefit from training in stress management and relaxation.     Screening Interventions   Interventions Encouraged to exercise;To provide support and resources with identified psychosocial needs;Provide feedback about the scores to participant      Quality of Life Scores:      Quality of Life - 07/15/17 1016      Quality of Life Scores   Health/Function Post 26.1 %   Socioeconomic Post 23.29 %    Psych/Spiritual Post 20.93 %   Family Post 23.4 %   GLOBAL Post 24.06 %      PHQ-9: Recent Review Flowsheet Data    Depression screen [facility] 2/9 07/15/2017 05/10/2017   Decreased Interest 0 0   Down, Depressed, Hopeless 0 0   PHQ - 2 Score 0 0   Altered sleeping 0 0   Tired, decreased energy 1 1   Change in appetite 0 0   Feeling bad or failure about yourself  0 0   Trouble concentrating 0 0   Moving slowly or fidgety/restless 0 0   Suicidal thoughts 0 0   PHQ-9 Score 1 1   Difficult  doing work/chores Not difficult at all Not difficult at all     Interpretation of Total Score  Total Score Depression Severity:  1-4 = Minimal depression, 5-9 = Mild depression, 10-14 = Moderate depression, 15-19 = Moderately severe depression, 20-27 = Severe depression   Psychosocial Evaluation and Intervention:     Psychosocial Evaluation - 06/10/17 0934      Psychosocial Evaluation & Interventions   Interventions Encouraged to exercise with the program and follow exercise prescription   Comments Counselor met with Mr. Lonzo Trudo) today for initial psychosocial evaluation.  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  Oather has a strong support system with a spouse (who lives separately); (2) daughters who are local and a good friend who checks in frequently.  Ayham has some hearing issues in addition to his heart problems.  Neev states he sleeps fine and has a good appetite.  He denies a history of depression or anxiety or any current symptoms.  He reports generally being in a positive mood and other than his health has minimal stress in his life.  Dayvion has goals to get back to running again upon completion of this program.  He has not done this in the past 4-5 years and hopes that will change as he gets his stamina and strength back.  Staff will follow with Joseph Esparza throughout the course of this program.     Expected Outcomes Stedman will benefit from consistent exercise to  achieve his stated goals.  The educational and psychoeducational components of this program will be helpful in understanding and managing his health more positively.    Continue Psychosocial Services  Follow up required by staff      Psychosocial Re-Evaluation:     Psychosocial Re-Evaluation    [place] Name 06/09/17 1200 06/09/17 1201 06/09/17 1202         Psychosocial Re-Evaluation   Current issues with None Identified  -  -     Comments Spouse does not live with him was reported before. PHQ9 score which scores for depression was very good at 0.  His quality of life scores were very good also.      Interventions Encouraged to attend Cardiac Rehabilitation for the exercise  -  -     Continue Psychosocial Services  Follow up required by staff  -  -        Psychosocial Discharge (Final Psychosocial Re-Evaluation):     Psychosocial Re-Evaluation - 06/09/17 1202      Psychosocial Re-Evaluation   Comments His quality of life scores were very good also.       Vocational Rehabilitation: Provide vocational rehab assistance to qualifying candidates.   Vocational Rehab Evaluation & Intervention:     Vocational Rehab - 05/10/17 1432      Initial Vocational Rehab Evaluation & Intervention   Assessment shows need for Vocational Rehabilitation No      Education: Education Goals: Education classes will be provided on a variety of topics geared toward better understanding of heart health and risk factor modification. Participant will state understanding/return demonstration of topics presented as noted by education test scores.  Learning Barriers/Preferences:     Learning Barriers/Preferences - 05/10/17 1431      Learning Barriers/Preferences   Learning Barriers None   Learning Preferences None      Education Topics: General Nutrition Guidelines/Fats and Fiber: -Group instruction provided by verbal, written material, models and posters to present the general  guidelines for  heart healthy nutrition. Gives an explanation and review of dietary fats and fiber.   Cardiac Rehab from 07/20/2017 in [facility] Cardiac and Pulmonary Rehab  Date  07/13/17  Educator  PI  Instruction Review Code  1- Verbalizes Understanding      Controlling Sodium/Reading Food Labels: -Group verbal and written material supporting the discussion of sodium use in heart healthy nutrition. Review and explanation with models, verbal and written materials for utilization of the food label.   Cardiac Rehab from 07/20/2017 in [facility] Cardiac and Pulmonary Rehab  Date  07/20/17  Educator  CR  Instruction Review Code  1- Verbalizes Understanding      Exercise Physiology & Risk Factors: - Group verbal and written instruction with models to review the exercise physiology of the cardiovascular system and associated critical values. Details cardiovascular disease risk factors and the goals associated with each risk factor.   Cardiac Rehab from 07/20/2017 in [facility] Cardiac and Pulmonary Rehab  Date  06/03/17  Educator  [facility]  Instruction Review Code  1- Verbalizes Understanding      Aerobic Exercise & Resistance Training: - Gives group verbal and written discussion on the health impact of inactivity. On the components of aerobic and resistive training programs and the benefits of this training and how to safely progress through these programs.   Cardiac Rehab from 07/20/2017 in [facility] Cardiac and Pulmonary Rehab  Date  06/08/17  Educator  [facility]  Instruction Review Code  1- Verbalizes Understanding      Flexibility, Balance, General Exercise Guidelines: - Provides group verbal and written instruction on the benefits of flexibility and balance training programs. Provides general exercise guidelines with specific guidelines to those with heart or lung disease. Demonstration and skill practice provided.   Cardiac Rehab from 07/20/2017 in [facility] Cardiac and Pulmonary Rehab  Date  06/10/17  Educator  AS  Instruction  Review Code  1- Verbalizes Understanding      Stress Management: - Provides group verbal and written instruction about the health risks of elevated stress, cause of high stress, and healthy ways to reduce stress.   Cardiac Rehab from 07/20/2017 in [facility] Cardiac and Pulmonary Rehab  Date  06/15/17  Educator  [facility]  Instruction Review Code  1- Verbalizes Understanding      Depression: - Provides group verbal and written instruction on the correlation between heart/lung disease and depressed mood, treatment options, and the stigmas associated with seeking treatment.   Cardiac Rehab from 07/20/2017 in [facility] Cardiac and Pulmonary Rehab  Date  07/06/17  Educator  [facility]  Instruction Review Code (retired)  2- meets goals/outcomes  Instruction Review Code  1- Actuary & Physiology of the Heart: - Group verbal and written instruction and models provide basic cardiac anatomy and physiology, with the coronary electrical and arterial systems. Review of: AMI, Angina, Valve disease, Heart Failure, Cardiac Arrhythmia, Pacemakers, and the ICD.   Cardiac Rehab from 07/20/2017 in [facility] Cardiac and Pulmonary Rehab  Date  06/17/17  Educator  [facility]  Instruction Review Code  1- Verbalizes Understanding      Cardiac Procedures: - Group verbal and written instruction to review commonly prescribed medications for heart disease. Reviews the medication, class of the drug, and side effects. Includes the steps to properly store meds and maintain the prescription regimen. (beta blockers and nitrates)   Cardiac Rehab from 07/20/2017 in [facility] Cardiac and Pulmonary Rehab  Date  06/22/17  Educator  SB  Instruction Review Code  1- Verbalizes Understanding      Cardiac Medications I: - Group verbal and written instruction to review commonly prescribed medications for heart disease. Reviews the medication, class of the drug, and side effects. Includes the steps to properly store meds and maintain  the prescription regimen.   Cardiac Rehab from 07/20/2017 in [facility] Cardiac and Pulmonary Rehab  Date  06/29/17  Educator  MA  Instruction Review Code  1- Verbalizes Understanding      Cardiac Medications II: -Group verbal and written instruction to review commonly prescribed medications for heart disease. Reviews the medication, class of the drug, and side effects. (all other drug classes)    Go Sex-Intimacy & Heart Disease, Get SMART - Goal Setting: - Group verbal and written instruction through game format to discuss heart disease and the return to sexual intimacy. Provides group verbal and written material to discuss and apply goal setting through the application of the S.M.A.R.T. Method.   Cardiac Rehab from 07/20/2017 in [facility] Cardiac and Pulmonary Rehab  Date  06/22/17  Educator  SB  Instruction Review Code  1- Verbalizes Understanding      Other Matters of the Heart: - Provides group verbal, written materials and models to describe Heart Failure, Angina, Valve Disease, Peripheral Artery Disease, and Diabetes in the realm of heart disease. Includes description of the disease process and treatment options available to the cardiac patient.   Cardiac Rehab from 07/20/2017 in [facility] Cardiac and Pulmonary Rehab  Date  06/17/17  Educator  [facility]  Instruction Review Code  1- Verbalizes Understanding      Exercise & Equipment Safety: - Individual verbal instruction and demonstration of equipment use and safety with use of the equipment.   Cardiac Rehab from 07/20/2017 in [facility] Cardiac and Pulmonary Rehab  Date  05/10/17  Educator  Sb      Infection Prevention: - Provides verbal and written material to individual with discussion of infection control including proper hand washing and proper equipment cleaning during exercise session.   Cardiac Rehab from 07/20/2017 in [facility] Cardiac and Pulmonary Rehab  Date  05/10/17  Educator  SB      Falls Prevention: - Provides verbal and written  material to individual with discussion of falls prevention and safety.   Cardiac Rehab from 07/20/2017 in [facility] Cardiac and Pulmonary Rehab  Date  05/10/17  Educator  Sb  Instruction Review Code (retired)  2- meets goals/outcomes      Diabetes: - Individual verbal and written instruction to review signs/symptoms of diabetes, desired ranges of glucose level fasting, after meals and with exercise. Acknowledge that pre and post exercise glucose checks will be done for 3 sessions at entry of program.   Other: -Provides group and verbal instruction on various topics (see comments)    Knowledge Questionnaire Score:     Knowledge Questionnaire Score - 07/15/17 1005      Knowledge Questionnaire Score   Post Score 28/28      Core Components/Risk Factors/Patient Goals at Admission:     Personal Goals and Risk Factors at Admission - 05/10/17 1427      Core Components/Risk Factors/Patient Goals on Admission    Weight Management Yes;Weight Loss   Intervention Weight Management: Develop a combined nutrition and exercise program designed to reach desired caloric intake, while maintaining appropriate intake of nutrient and fiber, sodium and fats, and appropriate energy expenditure required for the weight goal.;Weight Management/Obesity: Establish reasonable short term and long term weight goals.;Obesity: Provide  education and appropriate resources to help participant work on and attain dietary goals.;Weight Management: Provide education and appropriate resources to help participant work on and attain dietary goals.  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   Admit Weight 179 lb 12.8 oz (81.6 kg)   Goal Weight: Short Term 177 lb (80.3 kg)   Goal Weight: Long Term 155 lb (70.3 kg)   Expected Outcomes Long Term: Adherence to nutrition and physical activity/exercise program aimed toward attainment of established weight goal;Short Term: Continue to assess and  modify interventions until short term weight is achieved;Weight Loss: Understanding of general recommendations for a balanced deficit meal plan, which promotes 1-2 lb weight loss per week and includes a negative energy balance of [phone] kcal/d   Hypertension Yes   Intervention Provide education on lifestyle modifcations including regular physical activity/exercise, weight management, moderate sodium restriction and increased consumption of fresh fruit, vegetables, and low fat dairy, alcohol moderation, and smoking cessation.;Monitor prescription use compliance.   Expected Outcomes Short Term: Continued assessment and intervention until BP is < 140/6m HG in hypertensive participants. < 130/89mHG in hypertensive participants with diabetes, heart failure or chronic kidney disease.;Long Term: Maintenance of blood pressure at goal levels.   Lipids Yes   Intervention Provide education and support for participant on nutrition & aerobic/resistive exercise along with prescribed medications to achieve LDL '70mg'$ , HDL >'40mg'$ .   Expected Outcomes Short Term: Participant states understanding of desired cholesterol values and is compliant with medications prescribed. Participant is following exercise prescription and nutrition guidelines.;Long Term: Cholesterol controlled with medications as prescribed, with individualized exercise RX and with personalized nutrition plan. Value goals: LDL < '70mg'$ , HDL > 40 mg.      Core Components/Risk Factors/Patient Goals Review:      Goals and Risk Factor Review    Row Name 05/25/17 1100 06/22/17 0946           Core Components/Risk Factors/Patient Goals Review   Personal Goals Review Weight Management/Obesity;Hypertension Weight Management/Obesity;Hypertension      Review Joseph Esparza is to lose 20 lb.  He rarely eats meat and is keeping sodium below 1500 mg.  His BPs have been good when he attends HT.  He is also walking for 30 min on Friday Saturday and Sunday . KeRickardmaintains his heart healthy diet.  He has added time to his walking at home.  BP have been normal at HT.       Expected Outcomes Short - Joseph Esparza coontinue regular exercise and healthy dietary changes.  Long - Joseph Esparza lose 1-2 lb per week. Short - Joseph Esparza continue healthy lifestyle habits.  Long - Joseph Esparza maintain healthy habits after graduating cardiac rehab.         Core Components/Risk Factors/Patient Goals at Discharge (Final Review):      Goals and Risk Factor Review - 06/22/17 0946      Core Components/Risk Factors/Patient Goals Review   Personal Goals Review Weight Management/Obesity;Hypertension   Review Joseph Esparza his heart healthy diet.  He has added time to his walking at home.  BP have been normal at HT.    Expected Outcomes Short - KeTammyill continue healthy lifestyle habits.  Long - KeTrestenill maintain healthy habits after graduating cardiac rehab.      ITP Comments:     ITP Comments    Row Name 05/10/17 1409 06/09/17 1105 06/09/17 1202 07/07/17 0603000/23/18 1042  ITP Comments Medical review completed today. Initial ITP created fro review, changes as needed and cosign by Dr Loleta Chance.  Documentation of diagnosis can be found in [facility] 05/01/2017 Admission 30 day review. Continue with ITP unless directed changes per Medical Director review.   His PHQ9 scores which evaluate for depression were very good at zero.  30 day Review. Continue with ITP unless directed changes per Medical Director Review.  Lanny Hurst graduated today and Discharge ITP and Summary were sent.      Comments: Discharge ITP

## 2017-07-20 NOTE — Patient Instructions (Signed)
Discharge Instructions  Patient Details  Name: Joseph Esparza MRN: [id number] Date of Birth: 07-02-1946 Referring Provider:  Yolonda Kida, MD   Number of Visits: 36/36  Reason for Discharge:  Patient reached a stable level of exercise. Patient independent in their exercise. Patient has met program and personal goals.  Smoking History:  History  Smoking Status  . Former Smoker  . Packs/day: 0.50  . Years: 10.00  . Types: Cigarettes  . Quit date: 09/29/1963  Smokeless Tobacco  . Never Used    Comment: Quit in his 20's  smoked maybe 10 years    Diagnosis:  ST elevation myocardial infarction (STEMI), unspecified artery ([place])  Status post coronary artery stent placement  Initial Exercise Prescription:     Initial Exercise Prescription - 05/10/17 1400      Date of Initial Exercise RX and Referring Provider   Date 05/10/17   Referring Provider Lujean Amel MD     Treadmill   MPH 3.2   Grade 0.5   Minutes 15   METs 3.67     Recumbant Elliptical   Level 2   RPM 50   Minutes 15   METs 3     T5 Nustep   Level 3   SPM 100   Minutes 15   METs 3     Prescription Details   Frequency (times per week) 2   Duration Progress to 45 minutes of aerobic exercise without signs/symptoms of physical distress     Intensity   THRR 40-80% of Max Heartrate 96-130   Ratings of Perceived Exertion 11-13   Perceived Dyspnea 0-4     Progression   Progression Continue to progress workloads to maintain intensity without signs/symptoms of physical distress.     Resistance Training   Training Prescription Yes   Weight 4 lbs      Discharge Exercise Prescription (Final Exercise Prescription Changes):     Exercise Prescription Changes - 07/14/17 0900      Response to Exercise   Blood Pressure (Admit) 98/56   Blood Pressure (Exercise) 144/64   Blood Pressure (Exit) 98/52   Heart Rate (Admit) 70 bpm   Heart Rate (Exercise) 116 bpm   Heart Rate (Exit) 69 bpm   Rating of Perceived Exertion (Exercise) 15   Symptoms none   Duration Continue with 45 min of aerobic exercise without signs/symptoms of physical distress.   Intensity THRR unchanged     Progression   Progression Continue to progress workloads to maintain intensity without signs/symptoms of physical distress.   Average METs 3.78     Resistance Training   Training Prescription Yes   Weight 4 lb   Reps 10-15     Interval Training   Interval Training No     Treadmill   MPH 3.5   Grade 0.5   Minutes 15   METs 3.92     T5 Nustep   Level 3   Minutes 15   METs 3.4     Home Exercise Plan   Plans to continue exercise at Home (comment)  walking   Frequency Add 3 additional days to program exercise sessions.   Initial Home Exercises Provided 06/01/17      Functional Capacity:     6 Minute Walk    Row Name 05/10/17 1445 07/06/17 0851       6 Minute Walk   Phase Initial Discharge    Distance 1690 feet 2020 feet    Distance % Change  - 19.5 %  Distance Feet Change  - 330 ft    Walk Time 6 minutes 6 minutes    # of Rest Breaks 0 0    MPH 3.2 3.83    METS 3.8 4.09    RPE 15 15    VO2 Peak 13.31 14.31    Symptoms No No    Resting HR 62 bpm 59 bpm    Resting BP 144/70 106/50    Max Ex. HR 111 bpm 90 bpm    Max Ex. BP 156/74 136/74    2 Minute Post BP 132/70  -       Quality of Life:     Quality of Life - 07/15/17 1016      Quality of Life Scores   Health/Function Post 26.1 %   Socioeconomic Post 23.29 %   Psych/Spiritual Post 20.93 %   Family Post 23.4 %   GLOBAL Post 24.06 %      Personal Goals: Goals established at orientation with interventions provided to work toward goal.     Personal Goals and Risk Factors at Admission - 05/10/17 1427      Core Components/Risk Factors/Patient Goals on Admission    Weight Management Yes;Weight Loss   Intervention Weight Management: Develop a combined nutrition and exercise program designed to reach desired  caloric intake, while maintaining appropriate intake of nutrient and fiber, sodium and fats, and appropriate energy expenditure required for the weight goal.;Weight Management/Obesity: Establish reasonable short term and long term weight goals.;Obesity: Provide education and appropriate resources to help participant work on and attain dietary goals.;Weight Management: Provide education and appropriate resources to help participant work on and attain dietary goals.  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   Admit Weight 179 lb 12.8 oz (81.6 kg)   Goal Weight: Short Term 177 lb (80.3 kg)   Goal Weight: Long Term 155 lb (70.3 kg)   Expected Outcomes Long Term: Adherence to nutrition and physical activity/exercise program aimed toward attainment of established weight goal;Short Term: Continue to assess and modify interventions until short term weight is achieved;Weight Loss: Understanding of general recommendations for a balanced deficit meal plan, which promotes 1-2 lb weight loss per week and includes a negative energy balance of [phone] kcal/d   Hypertension Yes   Intervention Provide education on lifestyle modifcations including regular physical activity/exercise, weight management, moderate sodium restriction and increased consumption of fresh fruit, vegetables, and low fat dairy, alcohol moderation, and smoking cessation.;Monitor prescription use compliance.   Expected Outcomes Short Term: Continued assessment and intervention until BP is < 140/62m HG in hypertensive participants. < 130/83mHG in hypertensive participants with diabetes, heart failure or chronic kidney disease.;Long Term: Maintenance of blood pressure at goal levels.   Lipids Yes   Intervention Provide education and support for participant on nutrition & aerobic/resistive exercise along with prescribed medications to achieve LDL '70mg'$ , HDL >'40mg'$ .   Expected Outcomes Short Term: Participant  states understanding of desired cholesterol values and is compliant with medications prescribed. Participant is following exercise prescription and nutrition guidelines.;Long Term: Cholesterol controlled with medications as prescribed, with individualized exercise RX and with personalized nutrition plan. Value goals: LDL < '70mg'$ , HDL > 40 mg.       Personal Goals Discharge:     Goals and Risk Factor Review - 06/22/17 0946      Core Components/Risk Factors/Patient Goals Review   Personal Goals Review Weight Management/Obesity;Hypertension   Review KeTyriaintains his  heart healthy diet.  He has added time to his walking at home.  BP have been normal at HT.    Expected Outcomes Short - Antonia will continue healthy lifestyle habits.  Long - Peace will maintain healthy habits after graduating cardiac rehab.      Exercise Goals and Review:     Exercise Goals    Row Name 05/10/17 1449             Exercise Goals   Increase Physical Activity Yes       Intervention Provide advice, education, support and counseling about physical activity/exercise needs.;Develop an individualized exercise prescription for aerobic and resistive training based on initial evaluation findings, risk stratification, comorbidities and participant's personal goals.       Expected Outcomes Achievement of increased cardiorespiratory fitness and enhanced flexibility, muscular endurance and strength shown through measurements of functional capacity and personal statement of participant.       Increase Strength and Stamina Yes       Intervention Provide advice, education, support and counseling about physical activity/exercise needs.;Develop an individualized exercise prescription for aerobic and resistive training based on initial evaluation findings, risk stratification, comorbidities and participant's personal goals.       Expected Outcomes Achievement of increased cardiorespiratory fitness and enhanced flexibility,  muscular endurance and strength shown through measurements of functional capacity and personal statement of participant.          Nutrition & Weight - Outcomes:     Pre Biometrics - 05/10/17 1449      Pre Biometrics   Height 5' 8.6" (1.742 m)   Weight 179 lb 12.8 oz (81.6 kg)   Waist Circumference 38 inches   Hip Circumference 39 inches   Waist to Hip Ratio 0.97 %   BMI (Calculated) 26.9   Single Leg Stand 1.35 seconds       Nutrition:     Nutrition Therapy & Goals - 06/09/17 1157      Nutrition Therapy   RD appointment defered Yes      Nutrition Discharge:     Nutrition Assessments - 07/15/17 1013      MEDFICTS Scores   Post Score 13      Education Questionnaire Score:     Knowledge Questionnaire Score - 07/15/17 1005      Knowledge Questionnaire Score   Post Score 28/28      Goals reviewed with patient; copy given to patient.

## 2017-07-20 NOTE — Progress Notes (Signed)
Discharge Progress Report  Patient Details  Name: Joseph Esparza MRN: [id number] Date of Birth: 05/11/1946 Referring Provider:     Cardiac Rehab from 05/10/2017 in [facility]iac [facility]  Referring Provider  Lujean Amel MD       Number of Visits: 36/36  Reason for Discharge:  Patient reached a stable level of exercise. Patient independent in their exercise. Patient has met program and personal goals.  Smoking History:  History  Smoking Status  . Former Smoker  . Packs/day: 0.50  . Years: 10.00  . Types: Cigarettes  . Quit date: 09/29/1963  Smokeless Tobacco  . Never Used    Comment: Quit in his 20's  smoked maybe 10 years    Diagnosis:  ST elevation myocardial infarction (STEMI), unspecified artery ([place])  Status post coronary artery stent placement  ADL UCSD:   Initial Exercise Prescription:     Initial Exercise Prescription - 05/10/17 1400      Date of Initial Exercise RX and Referring Provider   Date 05/10/17   Referring Provider Lujean Amel MD     Treadmill   MPH 3.2   Grade 0.5   Minutes 15   METs 3.67     Recumbant Elliptical   Level 2   RPM 50   Minutes 15   METs 3     T5 Nustep   Level 3   SPM 100   Minutes 15   METs 3     Prescription Details   Frequency (times per week) 2   Duration Progress to 45 minutes of aerobic exercise without signs/symptoms of physical distress     Intensity   THRR 40-80% of Max Heartrate 96-130   Ratings of Perceived Exertion 11-13   Perceived Dyspnea 0-4     Progression   Progression Continue to progress workloads to maintain intensity without signs/symptoms of physical distress.     Resistance Training   Training Prescription Yes   Weight 4 lbs      Discharge Exercise Prescription (Final Exercise Prescription Changes):     Exercise Prescription Changes - 07/14/17 0900      Response to Exercise   Blood Pressure (Admit) 98/56   Blood Pressure (Exercise) 144/64   Blood Pressure  (Exit) 98/52   Heart Rate (Admit) 70 bpm   Heart Rate (Exercise) 116 bpm   Heart Rate (Exit) 69 bpm   Rating of Perceived Exertion (Exercise) 15   Symptoms none   Duration Continue with 45 min of aerobic exercise without signs/symptoms of physical distress.   Intensity THRR unchanged     Progression   Progression Continue to progress workloads to maintain intensity without signs/symptoms of physical distress.   Average METs 3.78     Resistance Training   Training Prescription Yes   Weight 4 lb   Reps 10-15     Interval Training   Interval Training No     Treadmill   MPH 3.5   Grade 0.5   Minutes 15   METs 3.92     T5 Nustep   Level 3   Minutes 15   METs 3.4     Home Exercise Plan   Plans to continue exercise at Home (comment)  walking   Frequency Add 3 additional days to program exercise sessions.   Initial Home Exercises Provided 06/01/17      Functional Capacity:     6 Minute Walk    Row Name 05/10/17 1445 07/06/17 7741  6 Minute Walk   Phase Initial Discharge    Distance 1690 feet 2020 feet    Distance % Change  - 19.5 %    Distance Feet Change  - 330 ft    Walk Time 6 minutes 6 minutes    # of Rest Breaks 0 0    MPH 3.2 3.83    METS 3.8 4.09    RPE 15 15    VO2 Peak 13.31 14.31    Symptoms No No    Resting HR 62 bpm 59 bpm    Resting BP 144/70 106/50    Max Ex. HR 111 bpm 90 bpm    Max Ex. BP 156/74 136/74    2 Minute Post BP 132/70  -       Psychological, QOL, Others - Outcomes: PHQ 2/9: Depression screen [facility] 2/9 07/15/2017 05/10/2017  Decreased Interest 0 0  Down, Depressed, Hopeless 0 0  PHQ - 2 Score 0 0  Altered sleeping 0 0  Tired, decreased energy 1 1  Change in appetite 0 0  Feeling bad or failure about yourself  0 0  Trouble concentrating 0 0  Moving slowly or fidgety/restless 0 0  Suicidal thoughts 0 0  PHQ-9 Score 1 1  Difficult doing work/chores Not difficult at all Not difficult at all    Quality of Life:      Quality of Life - 07/15/17 1016      Quality of Life Scores   Health/Function Post 26.1 %   Socioeconomic Post 23.29 %   Psych/Spiritual Post 20.93 %   Family Post 23.4 %   GLOBAL Post 24.06 %      Personal Goals: Goals established at orientation with interventions provided to work toward goal.     Personal Goals and Risk Factors at Admission - 05/10/17 1427      Core Components/Risk Factors/Patient Goals on Admission    Weight Management Yes;Weight Loss   Intervention Weight Management: Develop a combined nutrition and exercise program designed to reach desired caloric intake, while maintaining appropriate intake of nutrient and fiber, sodium and fats, and appropriate energy expenditure required for the weight goal.;Weight Management/Obesity: Establish reasonable short term and long term weight goals.;Obesity: Provide education and appropriate resources to help participant work on and attain dietary goals.;Weight Management: Provide education and appropriate resources to help participant work on and attain dietary goals.  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   Admit Weight 179 lb 12.8 oz (81.6 kg)   Goal Weight: Short Term 177 lb (80.3 kg)   Goal Weight: Long Term 155 lb (70.3 kg)   Expected Outcomes Long Term: Adherence to nutrition and physical activity/exercise program aimed toward attainment of established weight goal;Short Term: Continue to assess and modify interventions until short term weight is achieved;Weight Loss: Understanding of general recommendations for a balanced deficit meal plan, which promotes 1-2 lb weight loss per week and includes a negative energy balance of [id number] kcal/d   Hypertension Yes   Intervention Provide education on lifestyle modifcations including regular physical activity/exercise, weight management, moderate sodium restriction and increased consumption of fresh fruit, vegetables, and low fat dairy, alcohol  moderation, and smoking cessation.;Monitor prescription use compliance.   Expected Outcomes Short Term: Continued assessment and intervention until BP is < 140/68m HG in hypertensive participants. < 130/866mHG in hypertensive participants with diabetes, heart failure or chronic kidney disease.;Long Term: Maintenance of blood pressure at goal levels.  Lipids Yes   Intervention Provide education and support for participant on nutrition & aerobic/resistive exercise along with prescribed medications to achieve LDL '70mg'$ , HDL >'40mg'$ .   Expected Outcomes Short Term: Participant states understanding of desired cholesterol values and is compliant with medications prescribed. Participant is following exercise prescription and nutrition guidelines.;Long Term: Cholesterol controlled with medications as prescribed, with individualized exercise RX and with personalized nutrition plan. Value goals: LDL < '70mg'$ , HDL > 40 mg.       Personal Goals Discharge:     Goals and Risk Factor Review    Row Name 05/25/17 1100 06/22/17 0946           Core Components/Risk Factors/Patient Goals Review   Personal Goals Review Weight Management/Obesity;Hypertension Weight Management/Obesity;Hypertension      Review Palladino goal is to lose 20 lb.  He rarely eats meat and is keeping sodium below 1500 mg.  His BPs have been good when he attends HT.  He is also walking for 30 min on Friday Saturday and Sunday . Karon maintains his heart healthy diet.  He has added time to his walking at home.  BP have been normal at HT.       Expected Outcomes Short - Clearence will coontinue regular exercise and healthy dietary changes.  Long - Gerome will lose 1-2 lb per week. Short - Anjel will continue healthy lifestyle habits.  Long - Trevis will maintain healthy habits after graduating cardiac rehab.         Exercise Goals and Review:     Exercise Goals    Row Name 05/10/17 1449             Exercise Goals   Increase Physical Activity  Yes       Intervention Provide advice, education, support and counseling about physical activity/exercise needs.;Develop an individualized exercise prescription for aerobic and resistive training based on initial evaluation findings, risk stratification, comorbidities and participant's personal goals.       Expected Outcomes Achievement of increased cardiorespiratory fitness and enhanced flexibility, muscular endurance and strength shown through measurements of functional capacity and personal statement of participant.       Increase Strength and Stamina Yes       Intervention Provide advice, education, support and counseling about physical activity/exercise needs.;Develop an individualized exercise prescription for aerobic and resistive training based on initial evaluation findings, risk stratification, comorbidities and participant's personal goals.       Expected Outcomes Achievement of increased cardiorespiratory fitness and enhanced flexibility, muscular endurance and strength shown through measurements of functional capacity and personal statement of participant.          Nutrition & Weight - Outcomes:     Pre Biometrics - 05/10/17 1449      Pre Biometrics   Height 5' 8.6" (1.742 m)   Weight 179 lb 12.8 oz (81.6 kg)   Waist Circumference 38 inches   Hip Circumference 39 inches   Waist to Hip Ratio 0.97 %   BMI (Calculated) 26.9   Single Leg Stand 1.35 seconds       Nutrition:     Nutrition Therapy & Goals - 06/09/17 1157      Nutrition Therapy   RD appointment defered Yes      Nutrition Discharge:     Nutrition Assessments - 07/15/17 1013      MEDFICTS Scores   Post Score 13      Education Questionnaire Score:     Knowledge Questionnaire Score - 07/15/17 1005  Knowledge Questionnaire Score   Post Score 28/28      Goals reviewed with patient; copy given to patient.

## 2017-07-20 NOTE — Progress Notes (Signed)
Daily Session Note  Patient Details  Name: Joseph Esparza MRN: [id number] Date of Birth: 1946-03-07 Referring Provider:     Cardiac Rehab from 05/10/2017 in [facility] Cardiac [facility] Pul[facility] Re[facility]  Referring Provider  Lujean Amel MD      Encounter Date: 07/20/2017  Check In:     Session Check In - 07/20/17 0828      Check-In   Location [facility]-Cardiac & Pulmonary Rehab   Staff Present Alberteen Sam, MA, ACSM RCEP, Exercise Physiologist;Susanne Bice, RN, BSN, Lance Sell, BA, ACSM CEP, Exercise Physiologist   Supervising physician immediately available to respond to emergencies See telemetry face sheet for immediately available ER MD   Medication changes reported     No   Fall or balance concerns reported    No   Warm-up and Cool-down Performed on first and last piece of equipment   Resistance Training Performed Yes   VAD Patient? No     Pain Assessment   Currently in Pain? No/denies   Multiple Pain Sites No         History  Smoking Status  . Former Smoker  . Packs/day: 0.50  . Years: 10.00  . Types: Cigarettes  . Quit date: 09/29/1963  Smokeless Tobacco  . Never Used    Comment: Quit in his 20's  smoked maybe 10 years    Goals Met:  Independence with exercise equipment Exercise tolerated well No report of cardiac concerns or symptoms Strength training completed today  Goals Unmet:  Not Applicable  Comments:  Kainalu graduated today from cardiac rehab with 36 sessions completed.  Details of the patient's exercise prescription and what He needs to do in order to continue the prescription and progress were discussed with patient.  Patient was given a copy of prescription and goals.  Patient verbalized understanding.  Aldous plans to continue to exercise by walking at home.    Dr. Emily Filbert is Medical Director for [facility].

## 2017-09-02 DIAGNOSIS — I5189 Other ill-defined heart diseases: Secondary | ICD-10-CM

## 2017-09-02 HISTORY — DX: Other ill-defined heart diseases: I51.89

## 2018-04-13 DIAGNOSIS — H9012 Conductive hearing loss, unilateral, left ear, with unrestricted hearing on the contralateral side: Secondary | ICD-10-CM | POA: Insufficient documentation

## 2018-11-01 ENCOUNTER — Other Ambulatory Visit: Payer: Self-pay | Admitting: Pediatrics

## 2018-11-01 DIAGNOSIS — Z136 Encounter for screening for cardiovascular disorders: Secondary | ICD-10-CM

## 2018-11-01 DIAGNOSIS — I10 Essential (primary) hypertension: Secondary | ICD-10-CM

## 2018-11-08 ENCOUNTER — Other Ambulatory Visit: Payer: Self-pay | Admitting: Pediatrics

## 2018-11-08 DIAGNOSIS — Z87891 Personal history of nicotine dependence: Secondary | ICD-10-CM

## 2018-11-08 DIAGNOSIS — I10 Essential (primary) hypertension: Secondary | ICD-10-CM

## 2018-11-08 DIAGNOSIS — Z136 Encounter for screening for cardiovascular disorders: Secondary | ICD-10-CM

## 2018-11-09 ENCOUNTER — Ambulatory Visit
Admission: RE | Admit: 2018-11-09 | Discharge: 2018-11-09 | Disposition: A | Discharge status: Home / Self Care | Payer: Medicare Other | Source: Ambulatory Visit | Attending: Pediatrics | Admitting: Pediatrics

## 2018-11-09 DIAGNOSIS — I10 Essential (primary) hypertension: Secondary | ICD-10-CM | POA: Diagnosis present

## 2018-11-09 DIAGNOSIS — Z87891 Personal history of nicotine dependence: Secondary | ICD-10-CM | POA: Diagnosis present

## 2018-11-09 DIAGNOSIS — Z136 Encounter for screening for cardiovascular disorders: Secondary | ICD-10-CM | POA: Insufficient documentation

## 2018-11-11 ENCOUNTER — Other Ambulatory Visit: Payer: Self-pay | Admitting: Pediatrics

## 2020-08-23 IMAGING — US US ABDOMINAL AORTA SCREENING AAA
1 series · 14 of 25 positions shown · non-contrast
Comparison: None.

CLINICAL DATA: Male between 65-75 years of age with a smoking
history.

EXAM:
US ABDOMINAL AORTA MEDICARE SCREENING
TECHNIQUE: Ultrasound examination of the abdominal aorta was performed as a
screening evaluation for abdominal aortic aneurysm.

[Series 1: us abdominal aorta screening aaa · 14 of 27 slices shown]
[im 1/27]
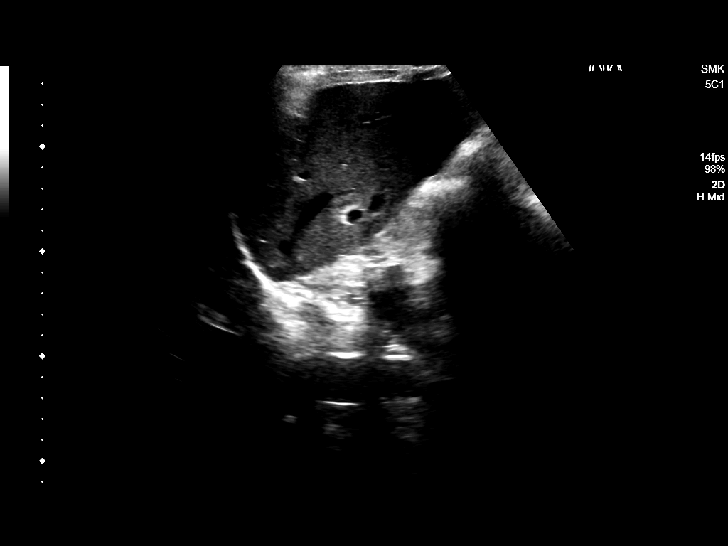
[im 3/27]
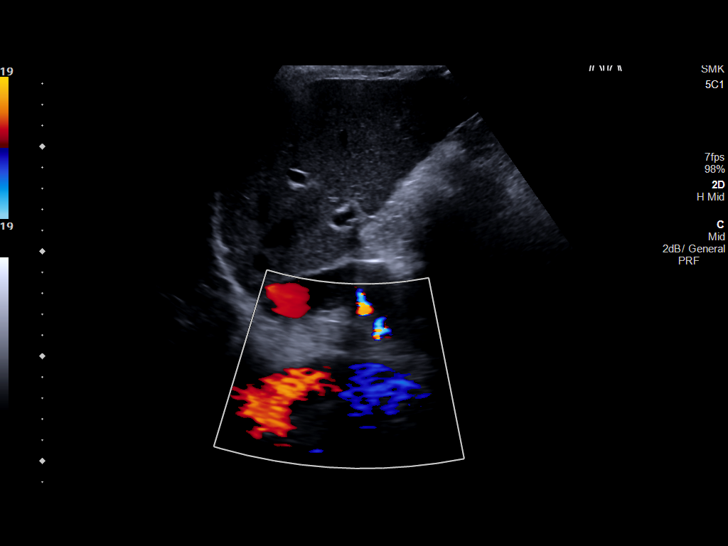
[im 5/27]
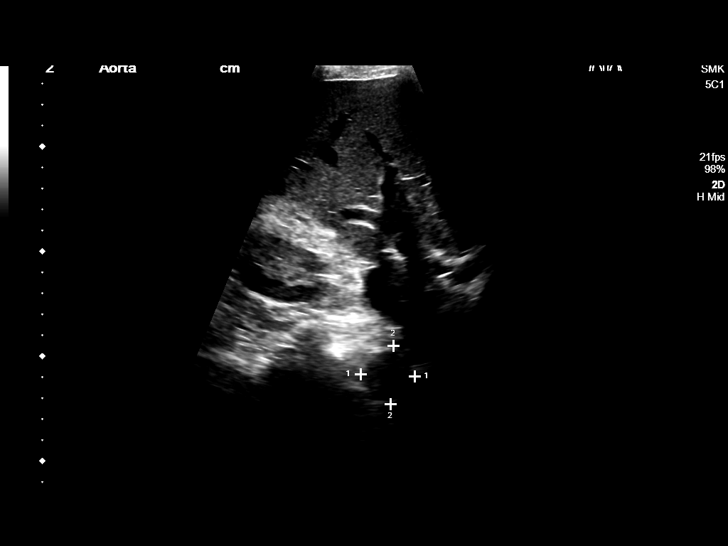
[im 7/27]
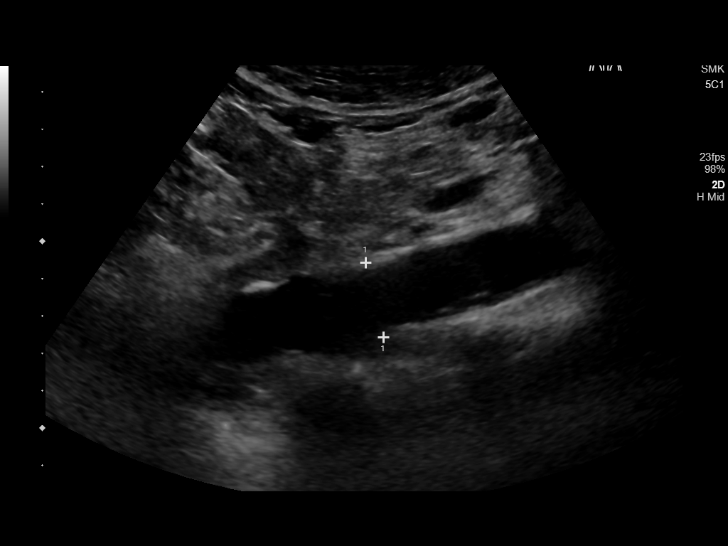
[im 9/27]
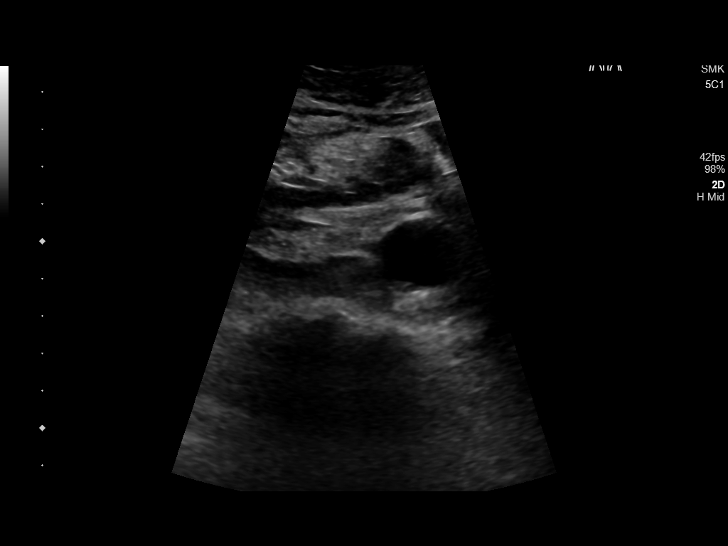
[im 10/27]
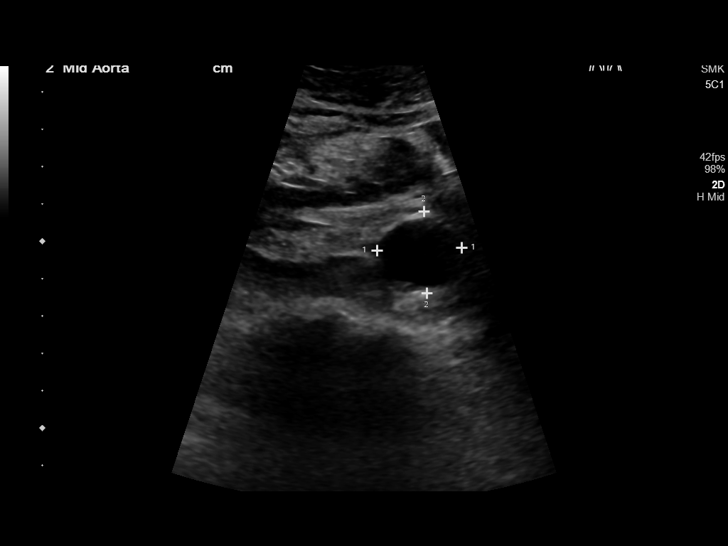
[im 12/27]
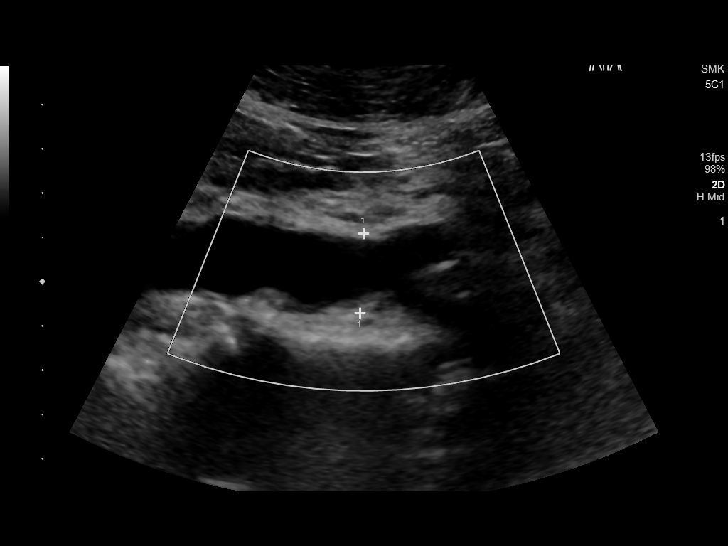
[im 15/27]
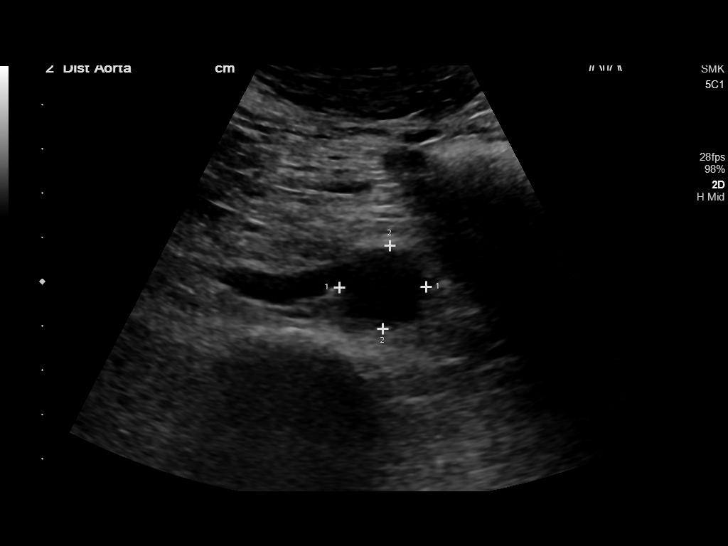
[im 17/27]
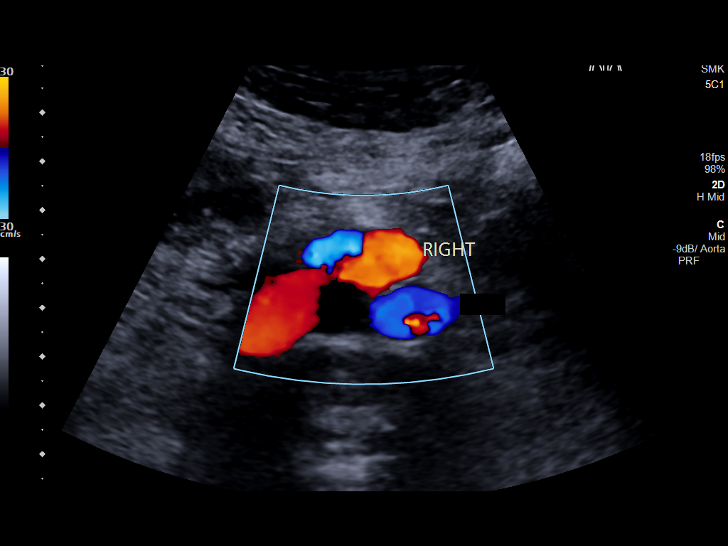
[im 18/27]
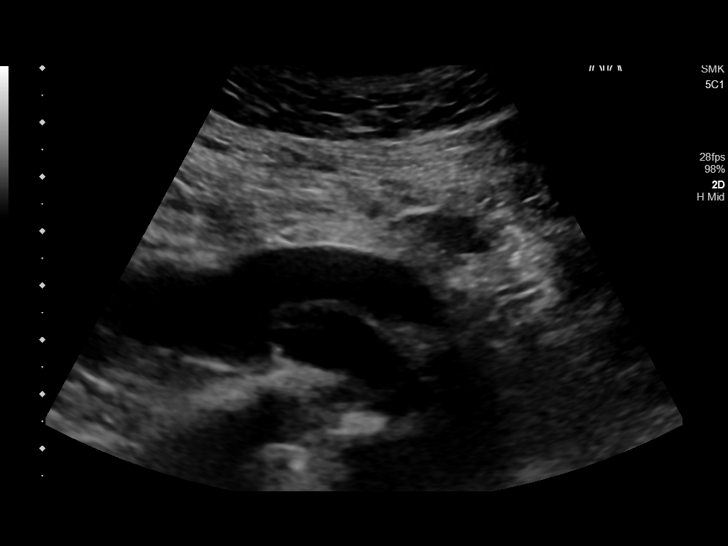
[im 20/27]
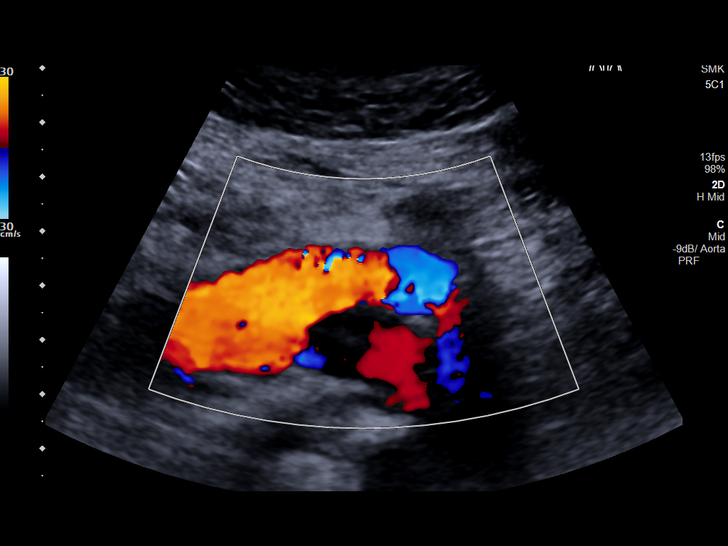
[im 22/27]
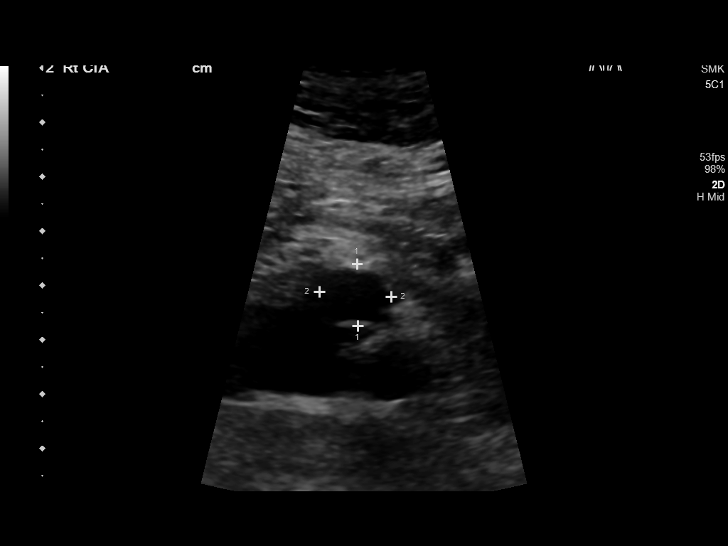
[im 24/27]
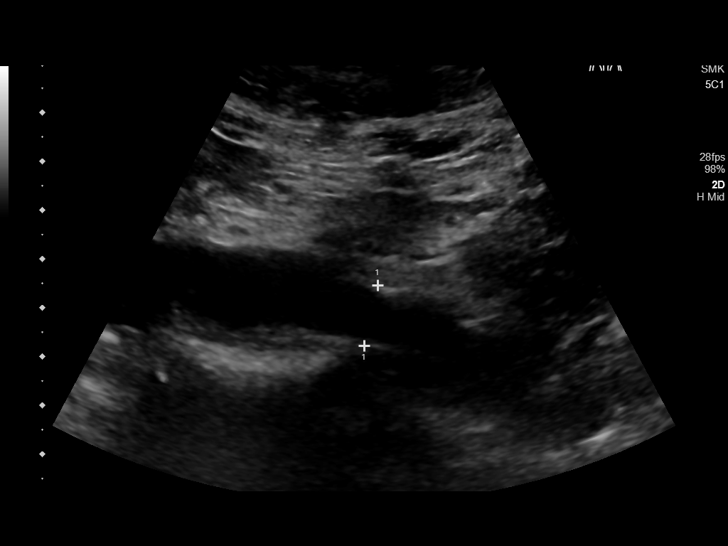
[im 27/27]
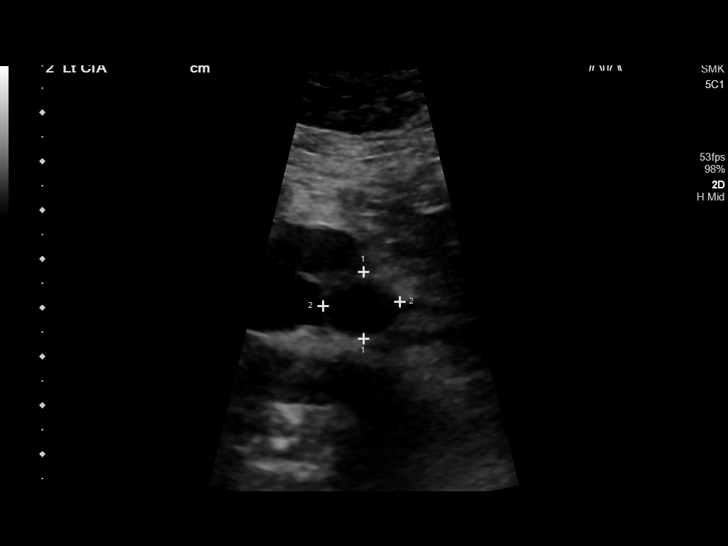

[14 of 25 positions shown; findings below may reference images not displayed]

FINDINGS: Abdominal aortic measurements as follows:

Proximal:  2.8 cm

Mid:  2.3 cm

Distal:  2.0 cm

Mild atherosclerotic plaque noted.
IMPRESSION: No evidence of abdominal aortic aneurysm.

## 2021-01-30 ENCOUNTER — Other Ambulatory Visit: Admission: RE | Admit: 2021-01-30 | Payer: Medicare Other | Source: Ambulatory Visit

## 2021-01-31 ENCOUNTER — Encounter: Payer: Self-pay | Admitting: *Deleted

## 2021-02-03 ENCOUNTER — Ambulatory Visit
Admission: RE | Admit: 2021-02-03 | Discharge: 2021-02-03 | Disposition: A | Discharge status: Home / Self Care | Payer: Medicare Other | Attending: Gastroenterology | Admitting: Gastroenterology

## 2021-02-03 ENCOUNTER — Other Ambulatory Visit: Payer: Self-pay

## 2021-02-03 ENCOUNTER — Ambulatory Visit: Payer: Medicare Other | Admitting: Anesthesiology

## 2021-02-03 ENCOUNTER — Encounter
Admission: RE | Disposition: A | Discharge status: Home / Self Care | Payer: Self-pay | Source: Home / Self Care | Attending: Gastroenterology

## 2021-02-03 ENCOUNTER — Encounter: Payer: Self-pay | Admitting: Anesthesiology

## 2021-02-03 DIAGNOSIS — Z7982 Long term (current) use of aspirin: Secondary | ICD-10-CM | POA: Diagnosis not present

## 2021-02-03 DIAGNOSIS — I2510 Atherosclerotic heart disease of native coronary artery without angina pectoris: Secondary | ICD-10-CM | POA: Diagnosis not present

## 2021-02-03 DIAGNOSIS — I10 Essential (primary) hypertension: Secondary | ICD-10-CM | POA: Insufficient documentation

## 2021-02-03 DIAGNOSIS — D122 Benign neoplasm of ascending colon: Secondary | ICD-10-CM | POA: Insufficient documentation

## 2021-02-03 DIAGNOSIS — Z87891 Personal history of nicotine dependence: Secondary | ICD-10-CM | POA: Insufficient documentation

## 2021-02-03 DIAGNOSIS — K640 First degree hemorrhoids: Secondary | ICD-10-CM | POA: Diagnosis not present

## 2021-02-03 DIAGNOSIS — Z955 Presence of coronary angioplasty implant and graft: Secondary | ICD-10-CM | POA: Insufficient documentation

## 2021-02-03 DIAGNOSIS — Z8719 Personal history of other diseases of the digestive system: Secondary | ICD-10-CM | POA: Diagnosis present

## 2021-02-03 DIAGNOSIS — Z79899 Other long term (current) drug therapy: Secondary | ICD-10-CM | POA: Diagnosis not present

## 2021-02-03 DIAGNOSIS — E785 Hyperlipidemia, unspecified: Secondary | ICD-10-CM | POA: Insufficient documentation

## 2021-02-03 DIAGNOSIS — Z8371 Family history of colonic polyps: Secondary | ICD-10-CM | POA: Insufficient documentation

## 2021-02-03 DIAGNOSIS — Z1211 Encounter for screening for malignant neoplasm of colon: Secondary | ICD-10-CM | POA: Diagnosis not present

## 2021-02-03 DIAGNOSIS — K5140 Inflammatory polyps of colon without complications: Secondary | ICD-10-CM | POA: Diagnosis not present

## 2021-02-03 DIAGNOSIS — D123 Benign neoplasm of transverse colon: Secondary | ICD-10-CM | POA: Diagnosis not present

## 2021-02-03 DIAGNOSIS — Z9049 Acquired absence of other specified parts of digestive tract: Secondary | ICD-10-CM | POA: Insufficient documentation

## 2021-02-03 HISTORY — PX: COLONOSCOPY WITH PROPOFOL: SHX5780

## 2021-02-03 HISTORY — DX: Acute myocardial infarction, unspecified: I21.9

## 2021-02-03 HISTORY — DX: Essential (primary) hypertension: I10

## 2021-02-03 HISTORY — DX: Atherosclerotic heart disease of native coronary artery without angina pectoris: I25.10

## 2021-02-03 SURGERY — COLONOSCOPY WITH PROPOFOL
Anesthesia: General

## 2021-02-03 MED ORDER — PROPOFOL 500 MG/50ML IV EMUL
INTRAVENOUS | Status: AC
  Filled 2021-02-03: qty 50

## 2021-02-03 MED ORDER — PROPOFOL 500 MG/50ML IV EMUL
INTRAVENOUS | Status: DC | PRN
  Administered 2021-02-03: 150 ug/kg/min via INTRAVENOUS

## 2021-02-03 MED ORDER — PHENYLEPHRINE HCL (PRESSORS) 10 MG/ML IV SOLN
INTRAVENOUS | Status: AC
  Filled 2021-02-03: qty 1

## 2021-02-03 MED ORDER — SODIUM CHLORIDE 0.9 % IV SOLN: INTRAVENOUS | Status: DC

## 2021-02-03 MED ORDER — EPHEDRINE 5 MG/ML INJ
INTRAVENOUS | Status: AC
  Filled 2021-02-03: qty 10

## 2021-02-03 MED ORDER — EPHEDRINE SULFATE 50 MG/ML IJ SOLN
INTRAMUSCULAR | Status: DC | PRN
  Administered 2021-02-03 (×2): 5 mg via INTRAVENOUS

## 2021-02-03 MED ORDER — PHENYLEPHRINE HCL (PRESSORS) 10 MG/ML IV SOLN
INTRAVENOUS | Status: DC | PRN
  Administered 2021-02-03: 50 ug via INTRAVENOUS

## 2021-02-03 NOTE — Interval H&P Note (Signed)
History and Physical Interval Note:  02/03/2021 1:46 PM  Joseph Esparza  has presented today for surgery, with the diagnosis of family history of polyps in colon.  The various methods of treatment have been discussed with the patient and family. After consideration of risks, benefits and other options for treatment, the patient has consented to  Procedure(s): COLONOSCOPY WITH PROPOFOL (N/A) as a surgical intervention.  The patient's history has been reviewed, patient examined, no change in status, stable for surgery.  I have reviewed the patient's chart and labs.  Questions were answered to the patient's satisfaction.     Lesly Rubenstein  Ok to proceed with colonoscopy

## 2021-02-03 NOTE — Op Note (Signed)
[facility] Patient Name: Joseph Esparza Procedure Date: 02/03/2021 1:39 PM MRN: [id number] Account #: [id number] Date of Birth: [birth year] Admit Type: Outpatient Age: 75 Room: [facility] ENDO ROOM 3 Gender: Male Note Status: Finalized Procedure:             Colonoscopy Indications:           Screening for colorectal malignant neoplasm Providers:             Andrey Farmer MD, MD Medicines:             Monitored Anesthesia Care Complications:         No immediate complications. Estimated blood loss:                         Minimal. Procedure:             Pre-Anesthesia Assessment:                        - Prior to the procedure, a History and Physical was                         performed, and patient medications and allergies were                         reviewed. The patient is competent. The risks and                         benefits of the procedure and the sedation options and                         risks were discussed with the patient. All questions                         were answered and informed consent was obtained.                         Patient identification and proposed procedure were                         verified by the physician, the nurse, the anesthetist                         and the technician in the endoscopy suite. Mental                         Status Examination: alert and oriented. Airway                         Examination: normal oropharyngeal airway and neck                         mobility. Respiratory Examination: clear to                         auscultation. CV Examination: normal. Prophylactic                         Antibiotics: The patient does not require prophylactic  antibiotics. Prior Anticoagulants: The patient has                         taken no previous anticoagulant or antiplatelet                         agents. ASA Grade Assessment: II - A patient with mild                          systemic disease. After reviewing the risks and                         benefits, the patient was deemed in satisfactory                         condition to undergo the procedure. The anesthesia                         plan was to use monitored anesthesia care (MAC).                         Immediately prior to administration of medications,                         the patient was re-assessed for adequacy to receive                         sedatives. The heart rate, respiratory rate, oxygen                         saturations, blood pressure, adequacy of pulmonary                         ventilation, and response to care were monitored                         throughout the procedure. The physical status of the                         patient was re-assessed after the procedure.                        After obtaining informed consent, the colonoscope was                         passed under direct vision. Throughout the procedure,                         the patient's blood pressure, pulse, and oxygen                         saturations were monitored continuously. The                         Colonoscope was introduced through the anus and                         advanced to the the terminal ileum. The colonoscopy  was performed without difficulty. The patient                         tolerated the procedure well. The quality of the bowel                         preparation was good. Findings:      The perianal and digital rectal examinations were normal.      The terminal ileum appeared normal.      Two sessile polyps were found in the ascending colon. The polyps were 2       to 3 mm in size. These polyps were removed with a cold snare. Resection       and retrieval were complete. Estimated blood loss was minimal.      A 3 mm polyp was found in the transverse colon. The polyp was sessile.       The polyp was removed with a cold snare. Resection and retrieval were        complete. Estimated blood loss was minimal.      A 1 mm polyp was found in the descending colon. The polyp was sessile.       The polyp was removed with a jumbo cold forceps. Resection and retrieval       were complete. Estimated blood loss was minimal.      Internal hemorrhoids were found during retroflexion. The hemorrhoids       were Grade I (internal hemorrhoids that do not prolapse).      The exam was otherwise without abnormality on direct and retroflexion       views. Impression:            - Two 2 to 3 mm polyps in the ascending colon, removed                         with a cold snare. Resected and retrieved.                        - One 3 mm polyp in the transverse colon, removed with                         a cold snare. Resected and retrieved.                        - One 1 mm polyp in the descending colon, removed with                         a jumbo cold forceps. Resected and retrieved.                        - Internal hemorrhoids.                        - The examination was otherwise normal on direct and                         retroflexion views. Recommendation:        - Discharge patient to home.                        - Resume previous  diet.                        - Continue present medications.                        - Await pathology results.                        - Repeat colonoscopy for surveillance based on                         pathology results.                        - Return to referring physician as previously                         scheduled. Procedure Code(s):     --- Professional ---                        [phone], Colonoscopy, flexible; with removal of                         tumor(s), polyp(s), or other lesion(s) by snare                         technique                        [id number], 73, Colonoscopy, flexible; with biopsy, single                         or multiple Diagnosis Code(s):     --- Professional ---                        K63.5,  Polyp of colon                        Z12.11, Encounter for screening for malignant neoplasm                         of colon                        K64.0, First degree hemorrhoids CPT copyright 2019 American Medical Association. All rights reserved. The codes documented in this report are preliminary and upon coder review may  be revised to meet current compliance requirements. Andrey Farmer MD, MD 02/03/2021 2:27:48 PM Number of Addenda: 0 Note Initiated On: 02/03/2021 1:39 PM Scope Withdrawal Time: 0 hours 11 minutes 53 seconds  Total Procedure Duration: 0 hours 19 minutes 10 seconds  Estimated Blood Loss:  Estimated blood loss was minimal.      [facility]

## 2021-02-03 NOTE — Anesthesia Preprocedure Evaluation (Signed)
Anesthesia Evaluation  Patient identified by MRN, date of birth, ID band Patient awake    Reviewed: Allergy & Precautions, NPO status , Patient's Chart, lab work & pertinent test results  Airway Mallampati: III  TM Distance: >3 FB Neck ROM: Full    Dental  (+) Poor Dentition   Pulmonary neg pulmonary ROS, former smoker,    Pulmonary exam normal        Cardiovascular Exercise Tolerance: Good hypertension, Pt. on medications and Pt. on home beta blockers + CAD, + Past MI and + Cardiac Stents  Normal cardiovascular exam     Neuro/Psych negative neurological ROS  negative psych ROS   GI/Hepatic negative GI ROS, Neg liver ROS,   Endo/Other  negative endocrine ROS  Renal/GU negative Renal ROS  negative genitourinary   Musculoskeletal negative musculoskeletal ROS (+)   Abdominal   Peds negative pediatric ROS (+)  Hematology negative hematology ROS (+)   Anesthesia Other Findings   Reproductive/Obstetrics negative OB ROS                             Anesthesia Physical Anesthesia Plan  ASA: III  Anesthesia Plan: General   Post-op Pain Management:    Induction: Intravenous  PONV Risk Score and Plan: 2 and Propofol infusion and TIVA  Airway Management Planned: Natural Airway and Nasal Cannula  Additional Equipment:   Intra-op Plan:   Post-operative Plan:   Informed Consent: I have reviewed the patients History and Physical, chart, labs and discussed the procedure including the risks, benefits and alternatives for the proposed anesthesia with the patient or authorized representative who has indicated his/her understanding and acceptance.       Plan Discussed with: CRNA, Anesthesiologist and Surgeon  Anesthesia Plan Comments:         Anesthesia Quick Evaluation

## 2021-02-03 NOTE — Anesthesia Procedure Notes (Signed)
Performed by: Cook-Martin, Mason Dibiasio Pre-anesthesia Checklist: Patient identified, Emergency Drugs available, Suction available, Patient being monitored and Timeout performed Patient Re-evaluated:Patient Re-evaluated prior to induction Oxygen Delivery Method: Nasal cannula Preoxygenation: Pre-oxygenation with 100% oxygen Induction Type: IV induction Placement Confirmation: positive ETCO2 and CO2 detector       

## 2021-02-03 NOTE — Transfer of Care (Signed)
Immediate Anesthesia Transfer of Care Note  Patient: Joseph Esparza  Procedure(s) Performed: COLONOSCOPY WITH PROPOFOL (N/A )  Patient Location: PACU  Anesthesia Type:General  Level of Consciousness: awake and sedated  Airway & Oxygen Therapy: Patient Spontanous Breathing and Patient connected to nasal cannula oxygen  Post-op Assessment: Report given to RN and Post -op Vital signs reviewed and stable  Post vital signs: Reviewed and stable  Last Vitals:  Vitals Value Taken Time  BP    Temp    Pulse    Resp    SpO2      Last Pain:  Vitals:   02/03/21 1332  TempSrc: Temporal  PainSc: 0-No pain         Complications: No complications documented.

## 2021-02-03 NOTE — H&P (Signed)
Outpatient short stay form Pre-procedure 02/03/2021 1:44 PM Raylene Miyamoto MD, MPH  Primary Physician: Dr. Janene Harvey  Reason for visit:  Screening colonoscopy  History of present illness:   75 y/o gentleman with history of hypertension, CAD, and HLD here for screening colonoscopy. Last colonoscopy was in 2004 with hyperplastic polyp. Brother with colon polyp. No family history of GI malignancies. History of appendectomy.    Current Facility-Administered Medications:  .  0.9 %  sodium chloride infusion, , Intravenous, Continuous, Hancel Ion, Hilton Cork, MD  Medications Prior to Admission  Medication Sig Dispense Refill Last Dose  . aspirin 81 MG chewable tablet Chew 1 tablet (81 mg total) by mouth daily. 30 tablet 3 02/02/2021 at Unknown time  . atorvastatin (LIPITOR) 80 MG tablet Take 1 tablet (80 mg total) by mouth daily at 6 PM. 30 tablet 3 02/03/2021 at Unknown time  . metoprolol tartrate (LOPRESSOR) 25 MG tablet Take 0.5 tablets (12.5 mg total) by mouth 2 (two) times daily. 30 tablet 3 02/03/2021 at Unknown time  . clopidogrel (PLAVIX) 75 MG tablet Take 1 tablet (75 mg total) by mouth daily with breakfast. (Patient not taking: Reported on 01/31/2021) 30 tablet 3 Not Taking at Unknown time  . lisinopril (PRINIVIL,ZESTRIL) 5 MG tablet Take 1 tablet (5 mg total) by mouth 2 (two) times daily. (Patient not taking: Reported on 02/03/2021) 30 tablet 3 Not Taking at Unknown time     No Known Allergies   Past Medical History:  Diagnosis Date  . Coronary artery disease   . Hypertension   . Myocardial infarction [facility])     Review of systems:  Otherwise negative.    Physical Exam  Gen: Alert, oriented. Appears stated age.  HEENT: PERRLA. Lungs: No respiratory distress CV: RRR Abd: soft, benign, no masses Ext: No edema    Planned procedures: Proceed with colonoscopy. The patient understands the nature of the planned procedure, indications, risks, alternatives and potential complications  including but not limited to bleeding, infection, perforation, damage to internal organs and possible oversedation/side effects from anesthesia. The patient agrees and gives consent to proceed.  Please refer to procedure notes for findings, recommendations and patient disposition/instructions.     Raylene Miyamoto MD, MPH Gastroenterology 02/03/2021  1:44 PM

## 2021-02-03 NOTE — Anesthesia Postprocedure Evaluation (Signed)
Anesthesia Post Note  Patient: Joseph Esparza  Procedure(s) Performed: COLONOSCOPY WITH PROPOFOL (N/A )  Patient location during evaluation: Phase II Anesthesia Type: General Level of consciousness: awake and alert, awake and oriented Pain management: pain level controlled Vital Signs Assessment: post-procedure vital signs reviewed and stable Respiratory status: spontaneous breathing, nonlabored ventilation and respiratory function stable Cardiovascular status: blood pressure returned to baseline and stable Postop Assessment: no apparent nausea or vomiting Anesthetic complications: no   No complications documented.   Last Vitals:  Vitals:   02/03/21 1332 02/03/21 1427  BP: 128/77   Pulse: (!) 59   Resp: 20   Temp: (!) 36.1 C 36.8 C  SpO2: 100%     Last Pain:  Vitals:   02/03/21 1427  TempSrc: Temporal  PainSc: 10-Worst pain ever                 Phill Mutter

## 2021-02-04 ENCOUNTER — Encounter: Payer: Self-pay | Admitting: Gastroenterology

## 2021-02-06 LAB — SURGICAL PATHOLOGY

## 2022-02-19 DIAGNOSIS — K635 Polyp of colon: Secondary | ICD-10-CM | POA: Insufficient documentation

## 2022-02-19 DIAGNOSIS — R7303 Prediabetes: Secondary | ICD-10-CM | POA: Insufficient documentation

## 2022-02-26 ENCOUNTER — Ambulatory Visit (INDEPENDENT_AMBULATORY_CARE_PROVIDER_SITE_OTHER): Payer: Medicare Other | Admitting: Surgery

## 2022-02-26 ENCOUNTER — Telehealth: Payer: Self-pay | Admitting: Surgery

## 2022-02-26 ENCOUNTER — Telehealth: Payer: Self-pay

## 2022-02-26 ENCOUNTER — Encounter: Payer: Self-pay | Admitting: Surgery

## 2022-02-26 ENCOUNTER — Ambulatory Visit: Payer: Self-pay | Admitting: Surgery

## 2022-02-26 ENCOUNTER — Other Ambulatory Visit: Payer: Self-pay

## 2022-02-26 VITALS — BP 116/71 | HR 57 | Temp 98.7°F | Ht 68.0 in | Wt 181.0 lb

## 2022-02-26 DIAGNOSIS — I2510 Atherosclerotic heart disease of native coronary artery without angina pectoris: Secondary | ICD-10-CM | POA: Insufficient documentation

## 2022-02-26 DIAGNOSIS — E785 Hyperlipidemia, unspecified: Secondary | ICD-10-CM | POA: Insufficient documentation

## 2022-02-26 DIAGNOSIS — K4090 Unilateral inguinal hernia, without obstruction or gangrene, not specified as recurrent: Secondary | ICD-10-CM | POA: Diagnosis not present

## 2022-02-26 DIAGNOSIS — I10 Essential (primary) hypertension: Secondary | ICD-10-CM | POA: Insufficient documentation

## 2022-02-26 NOTE — H&P (View-Only) (Signed)
Patient ID: Joseph Esparza, male   DOB: [birth year], 76 y.o.   MRN: [id number]  Chief Complaint: Left inguinal hernia  History of Present Illness Joseph Esparza is a 76 y.o. male with a progressively painful left inguinal hernia over the last 3 weeks.  Exacerbating features are standing, bending and coughing.  He has a notable bulge present.  Denies any issues with straining with bowel activity or straining with voiding.  No prior hernia repair, no prior abdominal surgery.  Cardiac history noted, reports running as his form of exercise not but a year ago.  Past Medical History Past Medical History:  Diagnosis Date   Coronary artery disease    Hypertension    Myocardial infarction [facility])       Past Surgical History:  Procedure Laterality Date   CARDIAC CATHETERIZATION     COLONOSCOPY WITH PROPOFOL N/A 02/03/2021   Procedure: COLONOSCOPY WITH PROPOFOL;  Surgeon: Lesly Rubenstein, MD;  Location: [facility] ENDOSCOPY;  Service: Endoscopy;  Laterality: N/A;   CORONARY BALLOON ANGIOPLASTY N/A 05/01/2017   Procedure: CORONARY BALLOON ANGIOPLASTY;  Surgeon: Yolonda Kida, MD;  Location: [place] CV LAB;  Service: Cardiovascular;  Laterality: N/A;   CORONARY/GRAFT ACUTE MI REVASCULARIZATION N/A 05/01/2017   Procedure: Coronary/Graft Acute MI Revascularization;  Surgeon: Yolonda Kida, MD;  Location: [place] [facility];  Service: Cardiovascular;  Laterality: N/A;   LEFT HEART CATH AND CORONARY ANGIOGRAPHY N/A 05/01/2017   Procedure: LEFT HEART CATH AND CORONARY ANGIOGRAPHY;  Surgeon: Yolonda Kida, MD;  Location: [place] [facility] LAB;  Service: Cardiovascular;  Laterality: N/A;   none     VASECTOMY N/A     No Known Allergies  Current Outpatient Medications  Medication Sig Dispense Refill   aspirin 81 MG chewable tablet Chew 1 tablet (81 mg total) by mouth daily. 30 tablet 3   atorvastatin (LIPITOR) 80 MG tablet Take 1 tablet (80 mg total) by mouth daily at 6 PM. 30 tablet 3    lisinopril (PRINIVIL,ZESTRIL) 5 MG tablet Take 1 tablet (5 mg total) by mouth 2 (two) times daily. 30 tablet 3   metoprolol tartrate (LOPRESSOR) 25 MG tablet Take 0.5 tablets (12.5 mg total) by mouth 2 (two) times daily. 30 tablet 3   No current facility-administered medications for this visit.    Family History Family History  Problem Relation Age of Onset   Heart disease Mother    Heart disease Father       Social History Social History   Tobacco Use   Smoking status: Former    Packs/day: 0.50    Years: 10.00    Pack years: 5.00    Types: Cigarettes    Quit date: 09/29/1963    Years since quitting: 58.4   Smokeless tobacco: Never   Tobacco comments:    Quit in his 20's  smoked maybe 10 years  Vaping Use   Vaping Use: Never used  Substance Use Topics   Alcohol use: Yes    Alcohol/week: 3.0 standard drinks    Types: 3 Cans of beer per week   Drug use: No        Review of Systems  Constitutional: Negative.   HENT:  Positive for hearing loss.   Eyes: Negative.   Respiratory: Negative.    Cardiovascular: Negative.   Gastrointestinal: Negative.   Genitourinary: Negative.   Skin: Negative.   Neurological: Negative.   Psychiatric/Behavioral: Negative.       Physical Exam Blood pressure 116/71, pulse Marland Kitchen)  57, temperature 98.7 F (37.1 C), temperature source Oral, height '5\' 8"'$  (1.727 m), weight 181 lb (82.1 kg), SpO2 (!) 5 %. Last Weight  Most recent update: 02/26/2022  9:45 AM    Weight  82.1 kg (181 lb)             CONSTITUTIONAL: Well developed, and nourished, appropriately responsive and aware without distress.   EYES: Sclera non-icteric.   EARS, NOSE, MOUTH AND THROAT:  The oropharynx is clear. Oral mucosa is pink and moist.  Hearing is intact to voice.  NECK: Trachea is midline, and there is no jugular venous distension.  LYMPH NODES:  Lymph nodes in the neck are not enlarged. RESPIRATORY:  Lungs are clear, and breath sounds are equal bilaterally. Normal  respiratory effort without pathologic use of accessory muscles. CARDIOVASCULAR: Heart is regular in rate and rhythm. GI: The abdomen is soft, nontender, and nondistended. There were no palpable masses. I did not appreciate hepatosplenomegaly. There were normal bowel sounds. GU: Left groin bulge, consistent with inguinal hernia, mildly tender.  No evidence of right inguinal hernia on Valsalva. MUSCULOSKELETAL:  Symmetrical muscle tone appreciated in all four extremities.    SKIN: Skin turgor is normal. No pathologic skin lesions appreciated.  NEUROLOGIC:  Motor and sensation appear grossly normal.  Cranial nerves are grossly without defect. PSYCH:  Alert and oriented to person, place and time. Affect is appropriate for situation.  Data Reviewed I have personally reviewed what is currently available of the patient's imaging, recent labs and medical records.   Labs:     Latest Ref Rng & Units 05/03/2017    3:27 AM 05/02/2017    6:00 AM 05/01/2017    8:56 PM  CBC  WBC 3.8 - 10.6 K/uL 11.9   12.8   9.5    Hemoglobin 13.0 - 18.0 g/dL 14.3   15.3   16.6    Hematocrit 40.0 - 52.0 % 42.3   44.4   48.1    Platelets 150 - 440 K/uL 188   211   237        Latest Ref Rng & Units 05/03/2017    3:27 AM 05/02/2017    6:00 AM 05/01/2017    8:56 PM  CMP  Glucose 65 - 99 mg/dL 108   114   105    BUN 6 - 20 mg/dL '20   19   24    '$ Creatinine 0.61 - 1.24 mg/dL 0.86   0.82   1.04    Sodium 135 - 145 mmol/L 134   137   138    Potassium 3.5 - 5.1 mmol/L 3.6   3.8   3.9    Chloride 101 - 111 mmol/L 104   107   105    CO2 22 - 32 mmol/L '23   23   24    '$ Calcium 8.9 - 10.3 mg/dL 8.7   8.8   9.6    Total Protein 6.5 - 8.1 g/dL  6.7   7.9    Total Bilirubin 0.3 - 1.2 mg/dL  1.5   1.0    Alkaline Phos 38 - 126 U/L  93   110    AST 15 - 41 U/L  275   26    ALT 17 - 63 U/L  56   22        Imaging:  Within last 24 hrs: No results found.  Assessment    Left inguinal hernia. 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  I discussed possibility of incarceration, strangulation, enlargement in size over time, and the need for emergency surgery in the face of these.  Also reviewed the techniques of reduction should incarceration occur, and when unsuccessful to present to the ED.  Also discussed that surgery risks include recurrence which can be up to 30% in the case of complex hernias, use of prosthetic materials (mesh) and the increased risk of infection and the possible need for re-operation and removal of mesh, possibility of post-op SBO or ileus, and the risks of general anesthetic including heart attack, stroke, sudden death or some reaction to anesthetic medications. The patient, and those present, appear to understand the risks, any and all questions were answered to the patient's satisfaction.  No guarantees were ever expressed or implied.   Face-to-face time spent with the patient and accompanying care providers(if present) was 30 minutes, with more than 50% of the time spent counseling, educating, and coordinating care of the patient.    These notes generated with voice recognition software. I apologize for typographical errors.  Ronny Bacon M.D., FACS 02/26/2022, 10:12 AM

## 2022-02-26 NOTE — Patient Instructions (Addendum)
Cardiac Clearance faxed to [facility]. Please call his office to schedule an appointment prior to your surgery.    Our surgery scheduler will call you within 24-48 hours to schedule your surgery. Please have the [facility] surgery sheet available when speaking with her.   Inguinal Hernia, Adult An inguinal hernia develops when fat or the intestines push through a weak spot in a muscle where the leg meets the lower abdomen (groin). This creates a bulge. This kind of hernia could also be: In the scrotum, if you are male. In folds of skin around the vagina, if you are male. There are three types of inguinal hernias: Hernias that can be pushed back into the abdomen (are reducible). This type rarely causes pain. Hernias that are not reducible (are incarcerated). Hernias that are not reducible and lose their blood supply (are strangulated). This type of hernia requires emergency surgery. What are the causes? This condition is caused by having a weak spot in the muscles or tissues in your groin. This develops over time. The hernia may poke through the weak spot when you suddenly strain your lower abdominal muscles, such as when you: Lift a heavy object. Strain to have a bowel movement. Constipation can lead to straining. Cough. What increases the risk? This condition is more likely to develop in: Males. Pregnant females. People who: Are overweight. Work in jobs that require long periods of standing or heavy lifting. Have had an inguinal hernia before. Smoke or have lung disease. These factors can lead to long-term (chronic) coughing. What are the signs or symptoms? Symptoms may depend on the size of the hernia. Often, a small inguinal hernia has no symptoms. Symptoms of a larger hernia may include: A bulge in the groin area. This is easier to see when standing. It might not be visible when lying down. Pain or burning in the groin. This may get worse when lifting, straining, or coughing. A dull  ache or a feeling of pressure in the groin. An unusual bulge in the scrotum, in males. Symptoms of a strangulated inguinal hernia may include: A bulge in your groin that is very painful and tender to the touch. A bulge that turns red or purple. Fever, nausea, and vomiting. Inability to have a bowel movement or to pass gas. How is this diagnosed? This condition is diagnosed based on your symptoms, your medical history, and a physical exam. Your health care provider may feel your groin area and ask you to cough. How is this treated? Treatment depends on the size of your hernia and whether you have symptoms. If you do not have symptoms, your health care provider may have you watch your hernia carefully and have you come in for follow-up visits. If your hernia is large or if you have symptoms, you may need surgery to repair the hernia. Follow these instructions at home: Lifestyle Avoid lifting heavy objects. Avoid standing for long periods of time. Do not use any products that contain nicotine or tobacco. These products include cigarettes, chewing tobacco, and vaping devices, such as e-cigarettes. If you need help quitting, ask your health care provider. Maintain a healthy weight. Preventing constipation You may need to take these actions to prevent or treat constipation: Drink enough fluid to keep your urine pale yellow. Take over-the-counter or prescription medicines. Eat foods that are high in fiber, such as beans, whole grains, and fresh fruits and vegetables. Limit foods that are high in fat and processed sugars, such as fried or sweet foods. General  instructions You may try to push the hernia back in place by very gently pressing on it while lying down. Do not try to force the bulge back in if it will not push in easily. Watch your hernia for any changes in shape, size, or color. Get help right away if you notice any changes. Take over-the-counter and prescription medicines only as told  by your health care provider. Keep all follow-up visits. This is important. Contact a health care provider if: You have a fever or chills. You develop new symptoms. Your symptoms get worse. Get help right away if: You have pain in your groin that suddenly gets worse. You have a bulge in your groin that: Suddenly gets bigger and does not get smaller. Becomes red or purple or painful to the touch. You are a man and you have a sudden pain in your scrotum, or the size of your scrotum suddenly changes. You cannot push the hernia back in place by very gently pressing on it when you are lying down. You have nausea or vomiting that does not go away. You have a fast heartbeat. You cannot have a bowel movement or pass gas. These symptoms may represent a serious problem that is an emergency. Do not wait to see if the symptoms will go away. Get medical help right away. Call your local emergency services (911 in the [place] Summary An inguinal hernia develops when fat or the intestines push through a weak spot in a muscle where your leg meets your lower abdomen (groin). This condition is caused by having a weak spot in muscles or tissues in your groin. Symptoms may depend on the size of the hernia, and they may include pain or swelling in your groin. A small inguinal hernia often has no symptoms. Treatment may not be needed if you do not have symptoms. If you have symptoms or a large hernia, you may need surgery to repair the hernia. Avoid lifting heavy objects. Also, avoid standing for long periods of time. This information is not intended to replace advice given to you by your health care provider. Make sure you discuss any questions you have with your health care provider. Document Revised: 05/14/2020 Document Reviewed: 05/14/2020 Elsevier Patient Education  [employer].

## 2022-02-26 NOTE — Telephone Encounter (Signed)
Cardiac Clearance faxed to Dr.Dwayne Callwood.  

## 2022-02-26 NOTE — Progress Notes (Signed)
Patient ID: Joseph Esparza, male   DOB: 1946/01/05, 76 y.o.   MRN: [id number]  Chief Complaint: Left inguinal hernia  History of Present Illness Joseph Esparza is a 76 y.o. male with a progressively painful left inguinal hernia over the last 3 weeks.  Exacerbating features are standing, bending and coughing.  He has a notable bulge present.  Denies any issues with straining with bowel activity or straining with voiding.  No prior hernia repair, no prior abdominal surgery.  Cardiac history noted, reports running as his form of exercise not but a year ago.  Past Medical History Past Medical History:  Diagnosis Date   Coronary artery disease    Hypertension    Myocardial infarction [facility])       Past Surgical History:  Procedure Laterality Date   CARDIAC CATHETERIZATION     COLONOSCOPY WITH PROPOFOL N/A 02/03/2021   Procedure: COLONOSCOPY WITH PROPOFOL;  Surgeon: Lesly Rubenstein, MD;  Location: [facility] ENDOSCOPY;  Service: Endoscopy;  Laterality: N/A;   CORONARY BALLOON ANGIOPLASTY N/A 05/01/2017   Procedure: CORONARY BALLOON ANGIOPLASTY;  Surgeon: Yolonda Kida, MD;  Location: [place] [facility];  Service: Cardiovascular;  Laterality: N/A;   CORONARY/GRAFT ACUTE MI REVASCULARIZATION N/A 05/01/2017   Procedure: Coronary/Graft Acute MI Revascularization;  Surgeon: Yolonda Kida, MD;  Location: [place] [facility];  Service: Cardiovascular;  Laterality: N/A;   LEFT HEART CATH AND CORONARY ANGIOGRAPHY N/A 05/01/2017   Procedure: LEFT HEART CATH AND CORONARY ANGIOGRAPHY;  Surgeon: Yolonda Kida, MD;  Location: [place] [facility];  Service: Cardiovascular;  Laterality: N/A;   none     VASECTOMY N/A     No Known Allergies  Current Outpatient Medications  Medication Sig Dispense Refill   aspirin 81 MG chewable tablet Chew 1 tablet (81 mg total) by mouth daily. 30 tablet 3   atorvastatin (LIPITOR) 80 MG tablet Take 1 tablet (80 mg total) by mouth daily at 6 PM. 30 tablet 3    lisinopril (PRINIVIL,ZESTRIL) 5 MG tablet Take 1 tablet (5 mg total) by mouth 2 (two) times daily. 30 tablet 3   metoprolol tartrate (LOPRESSOR) 25 MG tablet Take 0.5 tablets (12.5 mg total) by mouth 2 (two) times daily. 30 tablet 3   No current facility-administered medications for this visit.    Family History Family History  Problem Relation Age of Onset   Heart disease Mother    Heart disease Father       Social History Social History   Tobacco Use   Smoking status: Former    Packs/day: 0.50    Years: 10.00    Pack years: 5.00    Types: Cigarettes    Quit date: 09/29/1963    Years since quitting: 58.4   Smokeless tobacco: Never   Tobacco comments:    Quit in his 20's  smoked maybe 10 years  Vaping Use   Vaping Use: Never used  Substance Use Topics   Alcohol use: Yes    Alcohol/week: 3.0 standard drinks    Types: 3 Cans of beer per week   Drug use: No        Review of Systems  Constitutional: Negative.   HENT:  Positive for hearing loss.   Eyes: Negative.   Respiratory: Negative.    Cardiovascular: Negative.   Gastrointestinal: Negative.   Genitourinary: Negative.   Skin: Negative.   Neurological: Negative.   Psychiatric/Behavioral: Negative.       Physical Exam Blood pressure 116/71, pulse Marland Kitchen)  57, temperature 98.7 F (37.1 C), temperature source Oral, height '5\' 8"'$  (1.727 m), weight 181 lb (82.1 kg), SpO2 (!) 5 %. Last Weight  Most recent update: 02/26/2022  9:45 AM    Weight  82.1 kg (181 lb)             CONSTITUTIONAL: Well developed, and nourished, appropriately responsive and aware without distress.   EYES: Sclera non-icteric.   EARS, NOSE, MOUTH AND THROAT:  The oropharynx is clear. Oral mucosa is pink and moist.  Hearing is intact to voice.  NECK: Trachea is midline, and there is no jugular venous distension.  LYMPH NODES:  Lymph nodes in the neck are not enlarged. RESPIRATORY:  Lungs are clear, and breath sounds are equal bilaterally. Normal  respiratory effort without pathologic use of accessory muscles. CARDIOVASCULAR: Heart is regular in rate and rhythm. GI: The abdomen is soft, nontender, and nondistended. There were no palpable masses. I did not appreciate hepatosplenomegaly. There were normal bowel sounds. GU: Left groin bulge, consistent with inguinal hernia, mildly tender.  No evidence of right inguinal hernia on Valsalva. MUSCULOSKELETAL:  Symmetrical muscle tone appreciated in all four extremities.    SKIN: Skin turgor is normal. No pathologic skin lesions appreciated.  NEUROLOGIC:  Motor and sensation appear grossly normal.  Cranial nerves are grossly without defect. PSYCH:  Alert and oriented to person, place and time. Affect is appropriate for situation.  Data Reviewed I have personally reviewed what is currently available of the patient's imaging, recent labs and medical records.   Labs:     Latest Ref Rng & Units 05/03/2017    3:27 AM 05/02/2017    6:00 AM 05/01/2017    8:56 PM  CBC  WBC 3.8 - 10.6 K/uL 11.9   12.8   9.5    Hemoglobin 13.0 - 18.0 g/dL 14.3   15.3   16.6    Hematocrit 40.0 - 52.0 % 42.3   44.4   48.1    Platelets 150 - 440 K/uL 188   211   237        Latest Ref Rng & Units 05/03/2017    3:27 AM 05/02/2017    6:00 AM 05/01/2017    8:56 PM  CMP  Glucose 65 - 99 mg/dL 108   114   105    BUN 6 - 20 mg/dL '20   19   24    '$ Creatinine 0.61 - 1.24 mg/dL 0.86   0.82   1.04    Sodium 135 - 145 mmol/L 134   137   138    Potassium 3.5 - 5.1 mmol/L 3.6   3.8   3.9    Chloride 101 - 111 mmol/L 104   107   105    CO2 22 - 32 mmol/L '23   23   24    '$ Calcium 8.9 - 10.3 mg/dL 8.7   8.8   9.6    Total Protein 6.5 - 8.1 g/dL  6.7   7.9    Total Bilirubin 0.3 - 1.2 mg/dL  1.5   1.0    Alkaline Phos 38 - 126 U/L  93   110    AST 15 - 41 U/L  275   26    ALT 17 - 63 U/L  56   22        Imaging:  Within last 24 hrs: No results found.  Assessment    Left inguinal hernia. 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  I discussed possibility of incarceration, strangulation, enlargement in size over time, and the need for emergency surgery in the face of these.  Also reviewed the techniques of reduction should incarceration occur, and when unsuccessful to present to the ED.  Also discussed that surgery risks include recurrence which can be up to 30% in the case of complex hernias, use of prosthetic materials (mesh) and the increased risk of infection and the possible need for re-operation and removal of mesh, possibility of post-op SBO or ileus, and the risks of general anesthetic including heart attack, stroke, sudden death or some reaction to anesthetic medications. The patient, and those present, appear to understand the risks, any and all questions were answered to the patient's satisfaction.  No guarantees were ever expressed or implied.   Face-to-face time spent with the patient and accompanying care providers(if present) was 30 minutes, with more than 50% of the time spent counseling, educating, and coordinating care of the patient.    These notes generated with voice recognition software. I apologize for typographical errors.  Ronny Bacon M.D., FACS 02/26/2022, 10:12 AM

## 2022-02-26 NOTE — Telephone Encounter (Signed)
Patient has been advised of Pre-Admission date/time, COVID Testing date and Surgery date.  Surgery Date: 03/11/22 Preadmission Testing Date: 03/05/22 (phone 8a-1p) Covid Testing Date: Not needed.   Patient has been made aware to call [phone], between 1-3:00pm the day before surgery, to find out what time to arrive for surgery.

## 2022-03-02 NOTE — Progress Notes (Unsigned)
Cardiac Clearance received from Dr Clayborn Bigness. Patient clearedc at [place] for surgery.

## 2022-03-05 ENCOUNTER — Telehealth: Payer: Self-pay | Admitting: *Deleted

## 2022-03-05 ENCOUNTER — Encounter
Admission: RE | Admit: 2022-03-05 | Discharge: 2022-03-05 | Disposition: A | Discharge status: Home / Self Care | Payer: Medicare Other | Source: Ambulatory Visit | Attending: Surgery | Admitting: Surgery

## 2022-03-05 HISTORY — DX: Prediabetes: R73.03

## 2022-03-05 HISTORY — DX: Gastro-esophageal reflux disease without esophagitis: K21.9

## 2022-03-05 HISTORY — DX: Personal history of urinary calculi: Z87.442

## 2022-03-05 HISTORY — DX: Conductive hearing loss, unilateral, left ear with restricted hearing on the contralateral side: H90.A12

## 2022-03-05 HISTORY — DX: Unspecified glaucoma: H40.9

## 2022-03-05 HISTORY — DX: Elevated prostate specific antigen (PSA): R97.20

## 2022-03-05 HISTORY — DX: Polyp of colon: K63.5

## 2022-03-05 HISTORY — DX: Unilateral inguinal hernia, without obstruction or gangrene, not specified as recurrent: K40.90

## 2022-03-05 HISTORY — DX: Hyperlipidemia, unspecified: E78.5

## 2022-03-05 NOTE — Telephone Encounter (Signed)
Patient called and stated that he is scheduled to have surgery with Dr Nicholes Stairs inguinal hernia on 03/11/22 and he needs to know if and when he needs to stop his baby aspirin. Please call and advise

## 2022-03-05 NOTE — Telephone Encounter (Signed)
Patient instructed that he may stop his baby Aspirin 5 days prior to surgery.

## 2022-03-05 NOTE — Patient Instructions (Signed)
Your procedure is scheduled on:03-11-22 Wednesday Report to the Registration Desk on the 1st floor of the [facility].Then proceed to the 2nd floor Surgery Desk To find out your arrival time, please call [phone] between 1PM - 3PM on:03-10-22 Tuesday If your arrival time is 6:00 am, do not arrive prior to that time as the [place] entrance doors do not open until 6:00 am.  REMEMBER: Instructions that are not followed completely may result in serious medical risk, up to and including death; or upon the discretion of your surgeon and anesthesiologist your surgery may need to be rescheduled.  Do not eat food OR drink any liquids after midnight the night before surgery.  No gum chewing, lozengers or hard candies.  TAKE THESE MEDICATIONS THE MORNING OF SURGERY WITH A SIP OF WATER: -atorvastatin (LIPITOR)  -metoprolol tartrate (LOPRESSOR)   Call Dr Forest Becker office today (03-05-22) about when you need to stop your 81 mg Aspirin  One week prior to surgery: Stop Anti-inflammatories (NSAIDS) such as Advil, Aleve, Ibuprofen, Motrin, Naproxen, Naprosyn and Aspirin based products such as Excedrin, Goodys Powder, BC Powder.You may however, take Tylenol if needed for pain up until the day of surgery. Stop ANY OVER THE COUNTER supplements/vitamins NOW (03-05-22) until after surgery.  No Alcohol for 24 hours before or after surgery.  No Smoking including e-cigarettes for 24 hours prior to surgery.  No chewable tobacco products for at least 6 hours prior to surgery.  No nicotine patches on the day of surgery.  Do not use any "recreational" drugs for at least a week prior to your surgery.  Please be advised that the combination of cocaine and anesthesia may have negative outcomes, up to and including death. If you test positive for cocaine, your surgery will be cancelled.  On the morning of surgery brush your teeth with toothpaste and water, you may rinse your mouth with mouthwash if you  wish. Do not swallow any toothpaste or mouthwash.  Use CHG Soap as directed on instruction sheet.  Do not wear jewelry, make-up, hairpins, clips or nail polish.  Do not wear lotions, powders, or perfumes.   Do not shave body from the neck down 48 hours prior to surgery just in case you cut yourself which could leave a site for infection.  Also, freshly shaved skin may become irritated if using the CHG soap.  Contact lenses, hearing aids and dentures may not be worn into surgery.  Do not bring valuables to the hospital. [facility] is not responsible for any missing/lost belongings or valuables.   Notify your doctor if there is any change in your medical condition (cold, fever, infection).  Wear comfortable clothing (specific to your surgery type) to the hospital.  After surgery, you can help prevent lung complications by doing breathing exercises.  Take deep breaths and cough every 1-2 hours. Your doctor may order a device called an Incentive Spirometer to help you take deep breaths. When coughing or sneezing, hold a pillow firmly against your incision with both hands. This is called "splinting." Doing this helps protect your incision. It also decreases belly discomfort.  If you are being admitted to the hospital overnight, leave your suitcase in the car. After surgery it may be brought to your room.  If you are being discharged the day of surgery, you will not be allowed to drive home. You will need a responsible adult (18 years or older) to drive you home and stay with you that night.   If  you are taking public transportation, you will need to have a responsible adult (18 years or older) with you. Please confirm with your physician that it is acceptable to use public transportation.   Please call the [facility] at [phone] if you have any questions about these instructions.  Surgery Visitation Policy:  Patients undergoing a surgery or procedure may have  two family members or support persons with them as long as the person is not COVID-19 positive or experiencing its symptoms.

## 2022-03-09 ENCOUNTER — Encounter: Payer: Self-pay | Admitting: Surgery

## 2022-03-09 NOTE — Progress Notes (Signed)
Perioperative Services  Pre-Admission/Anesthesia Testing Clinical Review  Date: 03/10/22  Patient Demographics:  Name: Joseph Esparza DOB:   05/05/1946 MRN:   [id number]  Planned Surgical Procedure(s):    Case: [id number] Date/Time: 03/11/22 1050   Procedure: XI ROBOTIC ASSISTED INGUINAL HERNIA (Left)   Anesthesia type: General   Pre-op diagnosis: left inguinal hernia   Location: [facility] OR ROOM 04 / [facility] FOR [facility]   Surgeons: Rodenberg, Denny, MD   NOTE: Available PAT nursing documentation and vital signs have been reviewed. Clinical nursing staff has updated patient's PMH/PSHx, current medication list, and drug allergies/intolerances to ensure comprehensive history available to assist in medical decision making as it pertains to the aforementioned surgical procedure and anticipated anesthetic course. Extensive review of available clinical information performed. [place] PMH and PSHx updated with any diagnoses/procedures that  may have been inadvertently omitted during his intake with the pre-admission testing department's nursing staff.  Clinical Discussion:  Joseph Esparza is a 76 y.o. male who is submitted for pre-surgical anesthesia review and clearance prior to him undergoing the above procedure. Patient is a Former Smoker (5 pack years; quit 09/1963). Pertinent PMH includes: CAD, STEMI, cardiomyopathy, diastolic dysfunction, angina, HTN, HLD, prediabetes, GERD (no daily Tx), inguinal hernia, BPH, hearing loss.  Patient is followed by cardiology ([place] 09/10/2021, MD). He was last seen in the cardiology clinic on 09/10/2021; notes reviewed.  At the time of his clinic visit, patient reported to be doing "reasonably well" from a cardiovascular perspective.  He denied any episodes of worsening angina, shortness breath, PND, orthopnea, palpitations, fatigue, significant peripheral edema, vertiginous symptoms, or presyncope/syncope.  Patient with a past medical history  significant for cardiovascular diagnoses.  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  Diagnostic left heart catheterization at that time revealed a moderately reduced left ventricular systolic function with an EF of 35-45%.  There was anteroapical akinesis.  Single-vessel CAD noted; 100% occlusion of the mid LAD.  Subsequent PCI was performed placing a 3.25 x 23 mm Xience Alpine DES x1 yielding excellent angiographic result and TIMI-3 flow.  Myocardial perfusion imaging study performed on 09/02/2017 revealed a low normal left ventricular systolic function with an EF of 50%.  There was anterior apical hypokinesis observed.  SPECT images demonstrated a moderate perfusion abnormality present in the anterior apical region suggestive of reversible ischemia.  Study determined to be abnormal.  TTE performed on 09/02/2017 revealed a mildly reduced left ventricular systolic function with an EF of 45%. Diastolic Doppler parameters consistent with abnormal relaxation (G1DD).  There was mild biatrial enlargement, in addition to mild right ventricular enlargement.  There was trivial to mild pan valvular regurgitation.  There was no evidence of a significant transvalvular gradient to suggest stenosis.  Blood pressure reasonably controlled in the 130/70 range on currently prescribed ARB and beta-blocker therapies.  Patient is on a statin for his HLD diagnosis and further ASCVD prevention.  He has a prediabetes diagnosis; last HgbA1c was 5.9% when checked on 02/19/2022.  Patient making efforts to be more active. Functional capacity, as defined by DASI, is documented as being >/= 4 METS.  No changes were made to his medication regimen.  Patient to follow-up with outpatient cardiology in 1 year or sooner if needed.  Onur R Hanks is scheduled for an elective ROBOTIC ASSISTED LEFT INGUINAL HERNIA on 03/11/2022 with Dr. Denny Rodenberg, MD. Given patient's past medical history significant for cardiovascular  diagnoses, presurgical cardiac clearance was sought by the performing surgeon's   office and PAT team. Per cardiology, "this patient is optimized for surgery and may proceed with the planned procedural course with a MODERATE risk of significant perioperative cardiovascular complications".  In review of his medication reconciliation, it is noted the patient is on daily antiplatelet therapy. He has been instructed on recommendations from his cardiologist for holding his daily low-dose ASA for 5 days prior to his procedure with plans to restart as soon as postoperative bleeding risk felt to be minimized by his primary attending surgeon. The patient is aware that his last dose of ASA should be on 03/05/2022.  Patient denies previous perioperative complications with anesthesia in the past. In review of the available records, it is noted that patient underwent a general anesthetic course here at [facility] ([facility]) in 01/2021 without documented complications.      02/26/2022    9:41 AM 02/03/2021    2:37 PM 02/03/2021    1:32 PM  Vitals with BMI  Height 5' 8"  5' 10"  Weight 181 lbs  165 lbs  BMI 27.53  23.68  Systolic 116 116 128  Diastolic 71 61 77  Pulse 57  59    Providers/Specialists:   NOTE: Primary physician provider listed below. Patient may have been seen by APP or partner within same practice.   PROVIDER ROLE / SPECIALTY LAST OV  Rodenberg, Denny, MD General Surgery (Surgeon) 02/26/2022  Behling, Karen, MD Primary Care Provider 02/19/2022  Callwood, Dwyane, MD Cardiology 09/10/2021   Allergies:  Patient has no known allergies.  Current Home Medications:   No current facility-administered medications for this encounter.    aspirin 81 MG chewable tablet   atorvastatin (LIPITOR) 80 MG tablet   latanoprost (XALATAN) 0.005 % ophthalmic solution   losartan (COZAAR) 25 MG tablet   metoprolol tartrate (LOPRESSOR) 25 MG tablet   History:   Past Medical  History:  Diagnosis Date   Anginal pain (HCC)    BPH (benign prostatic hyperplasia)    Cardiomyopathy (HCC) 05/01/2017   a.) STEMI 05/01/2017 --> STEMI --> EF 35-45%. b.) TTE 05/02/2017: EF 40-45%. c.) TTE 09/02/2017: EF 45%.   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 (HCC) 05/01/2017   a.) LHC 05/01/2017: EF 35-45%; anteroapical AK; 100% mLAD --> PCI placing a 3.25 x 23 mm Xience Alpine DES x 1   Past Surgical History:  Procedure Laterality Date   COLONOSCOPY WITH PROPOFOL N/A 02/03/2021   Procedure: COLONOSCOPY WITH PROPOFOL;  Surgeon: Locklear, Cameron T, MD;  Location: [facility] ENDOSCOPY;  Service: Endoscopy;  Laterality: N/A;   CORONARY BALLOON ANGIOPLASTY N/A 05/01/2017   Procedure: CORONARY BALLOON ANGIOPLASTY;  Surgeon: Callwood, Dwayne D, MD;  Location: [facility] INVASIVE CV LAB;  Service: Cardiovascular;  Laterality: N/A;   CORONARY/GRAFT ACUTE MI REVASCULARIZATION N/A 05/01/2017   Procedure: Coronary/Graft Acute MI   Revascularization;  Surgeon: Yolonda Kida, MD;  Location: [place] CV LAB;  Service: Cardiovascular;  Laterality: N/A;   LEFT HEART CATH AND CORONARY ANGIOGRAPHY N/A 05/01/2017   Procedure: LEFT HEART CATH AND CORONARY ANGIOGRAPHY;  Surgeon: Yolonda Kida, MD;  Location: [place] CV LAB;  Service: Cardiovascular;  Laterality: N/A;   VASECTOMY N/A    Family History  Problem Relation Age of Onset   Heart disease  Mother    Heart disease Father    Social History   Tobacco Use   Smoking status: Former    Packs/day: 0.50    Years: 10.00    Total pack years: 5.00    Types: Cigarettes    Quit date: 09/29/1963    Years since quitting: 58.4   Smokeless tobacco: Never   Tobacco comments:    Quit in his 20's  smoked maybe 10 years  Vaping Use   Vaping Use: Never used  Substance Use Topics   Alcohol use: Yes    Alcohol/week: 3.0 standard drinks of alcohol    Types: 3 Cans of beer per week    Comment: occ   Drug use: No    Pertinent Clinical Results:  LABS: Labs reviewed: Acceptable for surgery.   Ref Range & Units 02/19/2022  WBC (White Blood Cell Count) 3.2 - 9.8 x10^9/L 7.2   Hemoglobin 13.7 - 17.3 g/dL 14.7   Hematocrit 39.0 - 49.0 % 45.4   Platelets 150 - 450 x10^9/L 163   MCV (Mean Corpuscular Volume) 80 - 98 fL 89   MCH (Mean Corpuscular Hemoglobin) 26.5 - 34.0 pg 28.9   MCHC (Mean Corpuscular Hemoglobin Concentration) 31.5 - 36.3 % 32.4   RBC (Red Blood Cell Count) 4.37 - 5.74 x10^12/L 5.08   RDW-CV (Red Cell Distribution Width) 11.5 - 14.5 % 12.9   NRBC (Nucleated Red Blood Cell Count) 0 x10^9/L 0.00   NRBC % (Nucleated Red Blood Cell %) % 0.0   MPV (Mean Platelet Volume) 7.2 - 11.7 fL 10.4   Neutrophil Count 2.0 - 8.6 x10^9/L 5.0   Neutrophil % 37 - 80 % 70.6   Lymphocyte Count 0.6 - 4.2 x10^9/L 1.2   Lymphocyte % 10 - 50 % 16.2   Monocyte Count 0 - 0.9 x10^9/L 0.7   Monocyte % 0 - 12 % 9.2   Eosinophil Count 0 - 0.70 x10^9/L 0.20   Eosinophil % 0 - 7 % 2.8   Basophil Count 0 - 0.20 x10^9/L 0.06   Basophil % 0 - 2 % 0.8   Immature Granulocyte Count <=0.06 x10^9/L 0.03   Immature Granulocyte % <=0.7 % 0.4   Resulting Agency  [facility]  Specimen Collected: 02/19/22 11:07 Last Resulted: 02/19/22 16:31  Received From: [facility]  Result Received: 02/24/22 11:18    Ref Range & Units 02/19/2022  Sodium 135 - 145 mmol/L 139    Potassium 3.5 - 5.0 mmol/L 4.7   Chloride 98 - 108 mmol/L 107   Carbon Dioxide (CO2) 21 - 30 mmol/L 26   Urea Nitrogen (BUN) 7 - 20 mg/dL 19   Creatinine 0.6 - 1.3 mg/dL 1.0   Glucose 70 - 140 mg/dL 107   Calcium 8.7 - 10.2 mg/dL 9.2   AST (Aspartate Aminotransferase) 15 - 41 U/L 23   ALT (Alanine Aminotransferase) 17 - 63 U/L 27   Bilirubin, Total 0.4 - 1.5 mg/dL 0.6   Alk Phos (Alkaline Phosphatase) 24 - 110 U/L 97  Albumin 3.5 - 4.8 g/dL 3.6   Protein, Total 6.2 - 8.1 g/dL 6.4   Anion Gap 3 - 12 mmol/L 6   BUN/CREA Ratio 6 - 27 19   Glomerular Filtration Rate (eGFR)  mL/min/1.73sq m 78   Resulting Agency  [facility]  Specimen Collected: 02/19/22 11:07 Last Resulted: 02/19/22 18:38  Received From: [facility]  Result Received: 02/24/22 11:18    Ref Range & Units 02/19/2022  Hemoglobin A1C <6.5 % 5.9   Average Blood Glucose (Calculated From HgBA1c Level) mg/dL 121   Resulting Agency  [facility]    ECG: Date: 02/27/2022 Time ECG obtained: 1032 AM Rate: 62 bpm Rhythm: normal sinus Axis (leads I and aVF): Normal Intervals: PR 184 ms. QRS 88 ms. QTc 382 ms. ST segment and T wave changes: Nonspecific anterior T wave abnormality; evidence of age undetermined anterior infarction  Comparison: Similar to previous tracing obtained on 06/27/2018   IMAGING / PROCEDURES: TRANSTHORACIC ECHOCARDIOGRAM performed on 40/34/7425 LVEF 95% Diastolic Doppler parameters consistent with abnormal relaxation (G1DD). Mild biatrial enlargement Mild right ventricular enlargement Trivial AR and PR Mild MR and TR No pericardial effusion  MYOCARDIAL PERFUSION IMAGING STUDY (LEXISCAN) performed on 09/02/2017 Low normal left ventricular systolic function with an EF of 50% Anterior apical wall hypokinesis SPECT images demonstrate moderate perfusion abnormality of moderate intensity in the anterior apical region suggestive of reversible  ischemia  LEFT HEART CATHETERIZATION AND CORONARY ANGIOGRAPHY performed on 05/01/2017 Moderately reduced left ventricular systolic function with an EF of 35-45% Mildly elevated LVEDP Single-vessel CAD 100% stenosis of the mid LAD PCI 3.25 x 23 mm Xience Alpine DES x1 to the mid LAD yielding excellent angiographic result and TIMI-3 flow     Impression and Plan:  QUINTON VOTH has been referred for pre-anesthesia review and clearance prior to him undergoing the planned anesthetic and procedural courses. Available labs, pertinent testing, and imaging results were personally reviewed by me. This patient has been appropriately cleared by cardiology with an overall MODERATE risk of significant perioperative cardiovascular complications.  Based on clinical review performed today (03/10/22), barring any significant acute changes in the patient's overall condition, it is anticipated that he will be able to proceed with the planned surgical intervention. Any acute changes in clinical condition may necessitate his procedure being postponed and/or cancelled. Patient will meet with anesthesia team (MD and/or CRNA) on the day of his procedure for preoperative evaluation/assessment. Questions regarding anesthetic course will be fielded at that time.   Pre-surgical instructions were reviewed with the patient during his PAT appointment and questions were fielded by PAT clinical staff. Patient was advised that if any questions or concerns arise prior to his procedure then he should return a call to PAT and/or his surgeon's office to discuss.  Honor Loh, MSN, APRN, FNP-C, CEN [facility]  Peri-operative Services Nurse Practitioner Phone: [phone] Fax: [phone] 03/10/22 12:48 PM  NOTE: This note has been prepared using Dragon dictation software. Despite my best ability to proofread, there is always the potential that unintentional transcriptional errors may still occur from this  process.

## 2022-03-11 ENCOUNTER — Other Ambulatory Visit: Payer: Self-pay

## 2022-03-11 ENCOUNTER — Ambulatory Visit: Payer: Medicare Other | Admitting: Urgent Care

## 2022-03-11 ENCOUNTER — Encounter: Payer: Self-pay | Admitting: Surgery

## 2022-03-11 ENCOUNTER — Encounter
Admission: RE | Disposition: A | Discharge status: Home / Self Care | Payer: Self-pay | Source: Home / Self Care | Attending: Surgery

## 2022-03-11 ENCOUNTER — Ambulatory Visit
Admission: RE | Admit: 2022-03-11 | Discharge: 2022-03-11 | Disposition: A | Discharge status: Home / Self Care | Payer: Medicare Other | Attending: Surgery | Admitting: Surgery

## 2022-03-11 DIAGNOSIS — R7303 Prediabetes: Secondary | ICD-10-CM | POA: Insufficient documentation

## 2022-03-11 DIAGNOSIS — I5030 Unspecified diastolic (congestive) heart failure: Secondary | ICD-10-CM | POA: Insufficient documentation

## 2022-03-11 DIAGNOSIS — I110 Hypertensive heart disease with heart failure: Secondary | ICD-10-CM | POA: Diagnosis not present

## 2022-03-11 DIAGNOSIS — Z87891 Personal history of nicotine dependence: Secondary | ICD-10-CM | POA: Insufficient documentation

## 2022-03-11 DIAGNOSIS — E785 Hyperlipidemia, unspecified: Secondary | ICD-10-CM | POA: Diagnosis not present

## 2022-03-11 DIAGNOSIS — I252 Old myocardial infarction: Secondary | ICD-10-CM | POA: Diagnosis not present

## 2022-03-11 DIAGNOSIS — K4090 Unilateral inguinal hernia, without obstruction or gangrene, not specified as recurrent: Secondary | ICD-10-CM | POA: Insufficient documentation

## 2022-03-11 DIAGNOSIS — I25119 Atherosclerotic heart disease of native coronary artery with unspecified angina pectoris: Secondary | ICD-10-CM | POA: Insufficient documentation

## 2022-03-11 DIAGNOSIS — Z955 Presence of coronary angioplasty implant and graft: Secondary | ICD-10-CM | POA: Diagnosis not present

## 2022-03-11 HISTORY — PX: INSERTION OF MESH: SHX5868

## 2022-03-11 HISTORY — DX: Benign prostatic hyperplasia without lower urinary tract symptoms: N40.0

## 2022-03-11 HISTORY — DX: Angina pectoris, unspecified: I20.9

## 2022-03-11 SURGERY — HERNIORRHAPHY, INGUINAL, ROBOT-ASSISTED, LAPAROSCOPIC
Anesthesia: General | Laterality: Left

## 2022-03-11 MED ORDER — CEFAZOLIN SODIUM-DEXTROSE 2-4 GM/100ML-% IV SOLN
INTRAVENOUS | Status: AC
  Filled 2022-03-11: qty 100

## 2022-03-11 MED ORDER — DEXAMETHASONE SODIUM PHOSPHATE 10 MG/ML IJ SOLN
INTRAMUSCULAR | Status: AC
  Filled 2022-03-11: qty 1

## 2022-03-11 MED ORDER — FAMOTIDINE 20 MG PO TABS: 20.0000 mg | ORAL_TABLET | Freq: Once | ORAL | Status: AC

## 2022-03-11 MED ORDER — CHLORHEXIDINE GLUCONATE CLOTH 2 % EX PADS: 6.0000 | MEDICATED_PAD | Freq: Once | CUTANEOUS | Status: DC

## 2022-03-11 MED ORDER — CELECOXIB 200 MG PO CAPS: 200.0000 mg | ORAL_CAPSULE | ORAL | Status: AC

## 2022-03-11 MED ORDER — ONDANSETRON HCL 4 MG/2ML IJ SOLN
INTRAMUSCULAR | Status: DC | PRN
  Administered 2022-03-11: 4 mg via INTRAVENOUS

## 2022-03-11 MED ORDER — DEXMEDETOMIDINE HCL IN NACL 200 MCG/50ML IV SOLN
INTRAVENOUS | Status: DC | PRN
  Administered 2022-03-11 (×2): 4 ug via INTRAVENOUS

## 2022-03-11 MED ORDER — ACETAMINOPHEN 500 MG PO TABS
ORAL_TABLET | ORAL | Status: AC
  Administered 2022-03-11: 1000 mg via ORAL
  Filled 2022-03-11: qty 2

## 2022-03-11 MED ORDER — GABAPENTIN 300 MG PO CAPS: 300.0000 mg | ORAL_CAPSULE | ORAL | Status: AC

## 2022-03-11 MED ORDER — HYDROCODONE-ACETAMINOPHEN 5-325 MG PO TABS: 1.0000 | ORAL_TABLET | Freq: Four times a day (QID) | ORAL | 0 refills | Status: DC | PRN

## 2022-03-11 MED ORDER — ONDANSETRON HCL 4 MG/2ML IJ SOLN
INTRAMUSCULAR | Status: AC
  Filled 2022-03-11: qty 2

## 2022-03-11 MED ORDER — CHLORHEXIDINE GLUCONATE 0.12 % MT SOLN
OROMUCOSAL | Status: AC
  Administered 2022-03-11: 15 mL via OROMUCOSAL
  Filled 2022-03-11: qty 15

## 2022-03-11 MED ORDER — CHLORHEXIDINE GLUCONATE 0.12 % MT SOLN
15.0000 mL | Freq: Once | OROMUCOSAL | Status: AC
Start: 2022-03-11 — End: 2022-03-11

## 2022-03-11 MED ORDER — BUPIVACAINE-EPINEPHRINE (PF) 0.25% -1:200000 IJ SOLN
INTRAMUSCULAR | Status: DC | PRN
  Administered 2022-03-11: 30 mL

## 2022-03-11 MED ORDER — ONDANSETRON HCL 4 MG/2ML IJ SOLN: 4.0000 mg | Freq: Once | INTRAMUSCULAR | Status: DC | PRN

## 2022-03-11 MED ORDER — FENTANYL CITRATE (PF) 100 MCG/2ML IJ SOLN
INTRAMUSCULAR | Status: AC
  Filled 2022-03-11: qty 2

## 2022-03-11 MED ORDER — ACETAMINOPHEN 500 MG PO TABS: 1000.0000 mg | ORAL_TABLET | ORAL | Status: AC

## 2022-03-11 MED ORDER — LACTATED RINGERS IV SOLN: INTRAVENOUS | Status: DC

## 2022-03-11 MED ORDER — SUGAMMADEX SODIUM 200 MG/2ML IV SOLN
INTRAVENOUS | Status: DC | PRN
  Administered 2022-03-11: 200 mg via INTRAVENOUS

## 2022-03-11 MED ORDER — PROPOFOL 10 MG/ML IV BOLUS
INTRAVENOUS | Status: DC | PRN
  Administered 2022-03-11: 150 mg via INTRAVENOUS

## 2022-03-11 MED ORDER — DEXAMETHASONE SODIUM PHOSPHATE 10 MG/ML IJ SOLN
INTRAMUSCULAR | Status: DC | PRN
  Administered 2022-03-11: 4 mg via INTRAVENOUS

## 2022-03-11 MED ORDER — IBUPROFEN 800 MG PO TABS: 800.0000 mg | ORAL_TABLET | Freq: Three times a day (TID) | ORAL | 0 refills | Status: DC | PRN

## 2022-03-11 MED ORDER — BUPIVACAINE-EPINEPHRINE (PF) 0.25% -1:200000 IJ SOLN
INTRAMUSCULAR | Status: AC
  Filled 2022-03-11: qty 30

## 2022-03-11 MED ORDER — ROCURONIUM BROMIDE 10 MG/ML (PF) SYRINGE
PREFILLED_SYRINGE | INTRAVENOUS | Status: AC
  Filled 2022-03-11: qty 10

## 2022-03-11 MED ORDER — FENTANYL CITRATE (PF) 100 MCG/2ML IJ SOLN
INTRAMUSCULAR | Status: DC | PRN
  Administered 2022-03-11: 25 ug via INTRAVENOUS
  Administered 2022-03-11: 50 ug via INTRAVENOUS
  Administered 2022-03-11: 25 ug via INTRAVENOUS

## 2022-03-11 MED ORDER — FENTANYL CITRATE (PF) 100 MCG/2ML IJ SOLN: 25.0000 ug | INTRAMUSCULAR | Status: DC | PRN

## 2022-03-11 MED ORDER — LIDOCAINE HCL (CARDIAC) PF 100 MG/5ML IV SOSY
PREFILLED_SYRINGE | INTRAVENOUS | Status: DC | PRN
  Administered 2022-03-11: 50 mg via INTRAVENOUS

## 2022-03-11 MED ORDER — BUPIVACAINE LIPOSOME 1.3 % IJ SUSP: 20.0000 mL | Freq: Once | INTRAMUSCULAR | Status: DC

## 2022-03-11 MED ORDER — FAMOTIDINE 20 MG PO TABS
ORAL_TABLET | ORAL | Status: AC
  Administered 2022-03-11: 20 mg via ORAL
  Filled 2022-03-11: qty 1

## 2022-03-11 MED ORDER — BUPIVACAINE LIPOSOME 1.3 % IJ SUSP
INTRAMUSCULAR | Status: AC
  Filled 2022-03-11: qty 20

## 2022-03-11 MED ORDER — EPHEDRINE SULFATE (PRESSORS) 50 MG/ML IJ SOLN
INTRAMUSCULAR | Status: DC | PRN
  Administered 2022-03-11 (×2): 5 mg via INTRAVENOUS

## 2022-03-11 MED ORDER — CEFAZOLIN SODIUM-DEXTROSE 2-4 GM/100ML-% IV SOLN
2.0000 g | INTRAVENOUS | Status: AC
  Administered 2022-03-11: 2 g via INTRAVENOUS

## 2022-03-11 MED ORDER — PROPOFOL 10 MG/ML IV BOLUS
INTRAVENOUS | Status: AC
  Filled 2022-03-11: qty 20

## 2022-03-11 MED ORDER — ROCURONIUM BROMIDE 100 MG/10ML IV SOLN
INTRAVENOUS | Status: DC | PRN
  Administered 2022-03-11: 50 mg via INTRAVENOUS

## 2022-03-11 MED ORDER — GABAPENTIN 300 MG PO CAPS
ORAL_CAPSULE | ORAL | Status: AC
  Administered 2022-03-11: 300 mg via ORAL
  Filled 2022-03-11: qty 1

## 2022-03-11 MED ORDER — ORAL CARE MOUTH RINSE: 15.0000 mL | Freq: Once | OROMUCOSAL | Status: AC

## 2022-03-11 MED ORDER — CELECOXIB 200 MG PO CAPS
ORAL_CAPSULE | ORAL | Status: AC
  Administered 2022-03-11: 200 mg via ORAL
  Filled 2022-03-11: qty 1

## 2022-03-11 SURGICAL SUPPLY — 50 items
ADH SKN CLS APL DERMABOND .7 (GAUZE/BANDAGES/DRESSINGS) ×2
BLADE CLIPPER SURG (BLADE) ×3 IMPLANT
COVER TIP SHEARS 8 DVNC (MISCELLANEOUS) ×2 IMPLANT
COVER TIP SHEARS 8MM DA VINCI (MISCELLANEOUS) ×1
COVER WAND RF STERILE (DRAPES) ×3 IMPLANT
DERMABOND ADVANCED (GAUZE/BANDAGES/DRESSINGS) ×1
DERMABOND ADVANCED .7 DNX12 (GAUZE/BANDAGES/DRESSINGS) ×2 IMPLANT
DRAPE ARM DVNC X/XI (DISPOSABLE) ×6 IMPLANT
DRAPE COLUMN DVNC XI (DISPOSABLE) ×2 IMPLANT
DRAPE DA VINCI XI ARM (DISPOSABLE) ×3
DRAPE DA VINCI XI COLUMN (DISPOSABLE) ×1
ELECT REM PT RETURN 9FT ADLT (ELECTROSURGICAL) ×3
ELECTRODE REM PT RTRN 9FT ADLT (ELECTROSURGICAL) ×2 IMPLANT
GLOVE ORTHO TXT STRL SZ7.5 (GLOVE) ×12 IMPLANT
GOWN STRL REUS W/ TWL LRG LVL3 (GOWN DISPOSABLE) ×2 IMPLANT
GOWN STRL REUS W/ TWL XL LVL3 (GOWN DISPOSABLE) ×4 IMPLANT
GOWN STRL REUS W/TWL LRG LVL3 (GOWN DISPOSABLE) ×6
GOWN STRL REUS W/TWL XL LVL3 (GOWN DISPOSABLE) ×6
GRASPER SUT TROCAR 14GX15 (MISCELLANEOUS) IMPLANT
IRRIGATION STRYKERFLOW (MISCELLANEOUS) IMPLANT
IRRIGATOR STRYKERFLOW (MISCELLANEOUS)
IV CATH ANGIO 14GX1.88 NO SAFE (IV SOLUTION) ×2 IMPLANT
IV NS 1000ML (IV SOLUTION)
IV NS 1000ML BAXH (IV SOLUTION) IMPLANT
KIT PINK PAD W/HEAD ARE REST (MISCELLANEOUS) ×3
KIT PINK PAD W/HEAD ARM REST (MISCELLANEOUS) ×2 IMPLANT
LABEL OR SOLS (LABEL) ×3 IMPLANT
MANIFOLD NEPTUNE II (INSTRUMENTS) ×3 IMPLANT
MESH 3DMAX LIGHT 4.1X6.2 LT LR (Mesh General) ×1 IMPLANT
NDL INSUFFLATION 14GA 120MM (NEEDLE) IMPLANT
NEEDLE HYPO 22GX1.5 SAFETY (NEEDLE) ×3 IMPLANT
NEEDLE INSUFFLATION 14GA 120MM (NEEDLE) IMPLANT
PACK LAP CHOLECYSTECTOMY (MISCELLANEOUS) ×3 IMPLANT
SEAL CANN UNIV 5-8 DVNC XI (MISCELLANEOUS) ×6 IMPLANT
SEAL XI 5MM-8MM UNIVERSAL (MISCELLANEOUS) ×3
SET TUBE SMOKE EVAC HIGH FLOW (TUBING) ×3 IMPLANT
SOLUTION ELECTROLUBE (MISCELLANEOUS) ×3 IMPLANT
SUT MNCRL 4-0 (SUTURE) ×3
SUT MNCRL 4-0 27XMFL (SUTURE) ×2
SUT STRATAFIX 0 PDS+ CT-2 23 (SUTURE)
SUT V-LOC 90 ABS 3-0 VLT  V-20 (SUTURE)
SUT V-LOC 90 ABS 3-0 VLT V-20 (SUTURE) IMPLANT
SUT VIC AB 0 CT2 27 (SUTURE) ×3 IMPLANT
SUT VIC AB 2-0 RB1 27 (SUTURE) ×3 IMPLANT
SUT VLOC 90 2/L VL 12 GS22 (SUTURE) IMPLANT
SUT VLOC 90 S/L VL9 GS22 (SUTURE) ×1 IMPLANT
SUTURE MNCRL 4-0 27XMF (SUTURE) ×2 IMPLANT
SUTURE STRATFX 0 PDS+ CT-2 23 (SUTURE) IMPLANT
TROCAR Z-THREAD FIOS 11X100 BL (TROCAR) IMPLANT
WATER STERILE IRR 500ML POUR (IV SOLUTION) ×2 IMPLANT

## 2022-03-11 NOTE — Interval H&P Note (Signed)
History and Physical Interval Note:  03/11/2022 10:27 AM  Joseph Esparza  has presented today for surgery, with the diagnosis of left inguinal hernia.  The various methods of treatment have been discussed with the patient and family. After consideration of risks, benefits and other options for treatment, the patient has consented to  Procedure(s): XI ROBOTIC [place] (Left) as a surgical intervention.  The patient's history has been reviewed, patient examined, no change in status, stable for surgery.  I have reviewed the patient's chart and labs.  Questions were answered to the patient's satisfaction.   The left side is marked.   Ronny Bacon

## 2022-03-11 NOTE — Anesthesia Preprocedure Evaluation (Signed)
Anesthesia Evaluation  Patient identified by MRN, date of birth, ID band Patient awake    Reviewed: Allergy & Precautions, NPO status , Patient's Chart, lab work & pertinent test results  History of Anesthesia Complications Negative for: history of anesthetic complications  Airway Mallampati: III  TM Distance: >3 FB Neck ROM: Full    Dental  (+) Poor Dentition, Dental Advidsory Given   Pulmonary neg pulmonary ROS, former smoker,    Pulmonary exam normal        Cardiovascular Exercise Tolerance: Good hypertension, Pt. on medications and Pt. on home beta blockers (-) angina+ CAD, + Past MI and + Cardiac Stents  Normal cardiovascular exam(-) dysrhythmias (-) Valvular Problems/Murmurs     Neuro/Psych negative neurological ROS  negative psych ROS   GI/Hepatic Neg liver ROS, GERD  ,  Endo/Other  negative endocrine ROS  Renal/GU negative Renal ROS  negative genitourinary   Musculoskeletal negative musculoskeletal ROS (+)   Abdominal   Peds negative pediatric ROS (+)  Hematology negative hematology ROS (+)   Anesthesia Other Findings Past Medical History: No date: Anginal pain (HCC) No date: BPH (benign prostatic hyperplasia) 05/01/2017: Cardiomyopathy ([place])     Comment:  a.) STEMI 05/01/2017 --> STEMI --> EF 35-45%. b.) TTE               05/02/2017: EF 40-45%. c.) TTE 09/02/2017: EF 45%. No date: Colon polyps No date: Conductive hearing loss of left ear with restricted hearing  of right ear 05/01/2017: Coronary artery disease     Comment:  a.) LHC 05/01/2017: EF 35-45%; anteroapical AK; 100%               mLAD --> PCI placing a 3.25 x 23 mm Xience Alpine DES x 1 78/29/5621: Diastolic dysfunction     Comment:  a.) TTE 09/02/2017: EF 45%; RVE, BAE; triv AR/PR, mild               MR/TR; G1DD. No date: Elevated PSA No date: GERD (gastroesophageal reflux disease)     Comment:  h/o No date: Glaucoma No date:  History of kidney stones No date: Hyperlipidemia No date: Hypertension No date: Left inguinal hernia No date: Pre-diabetes 05/01/2017: ST elevation myocardial infarction (STEMI) of anterior  wall (HCC)     Comment:  a.) LHC 05/01/2017: EF 35-45%; anteroapical AK; 100%               mLAD --> PCI placing a 3.25 x 23 mm Xience Alpine DES x 1   Reproductive/Obstetrics negative OB ROS                             Anesthesia Physical  Anesthesia Plan  ASA: 3  Anesthesia Plan: General   Post-op Pain Management:    Induction: Intravenous  PONV Risk Score and Plan: 2 and Ondansetron, Dexamethasone and Treatment may vary due to age or medical condition  Airway Management Planned: Oral ETT  Additional Equipment:   Intra-op Plan:   Post-operative Plan: Extubation in OR  Informed Consent: I have reviewed the patients History and Physical, chart, labs and discussed the procedure including the risks, benefits and alternatives for the proposed anesthesia with the patient or authorized representative who has indicated his/her understanding and acceptance.       Plan Discussed with: CRNA, Anesthesiologist and Surgeon  Anesthesia Plan Comments:         Anesthesia Quick Evaluation

## 2022-03-11 NOTE — Anesthesia Procedure Notes (Signed)
Procedure Name: Intubation Date/Time: 03/11/2022 12:33 PM  Performed by: Carmelina Paddock, RNPre-anesthesia Checklist: Patient identified, Patient being monitored, Timeout performed, Emergency Drugs available and Suction available Patient Re-evaluated:Patient Re-evaluated prior to induction Oxygen Delivery Method: Circle system utilized Preoxygenation: Pre-oxygenation with 100% oxygen Induction Type: IV induction Ventilation: Mask ventilation without difficulty Laryngoscope Size: Mac, 3 and 4 Grade View: Grade I Tube type: Oral Tube size: 7.5 mm Number of attempts: 1 Airway Equipment and Method: Stylet Placement Confirmation: ETT inserted through vocal cords under direct vision, positive ETCO2 and breath sounds checked- equal and bilateral Secured at: 22 cm Tube secured with: Tape Dental Injury: Teeth and Oropharynx as per pre-operative assessment

## 2022-03-11 NOTE — Discharge Instructions (Signed)

## 2022-03-11 NOTE — Transfer of Care (Signed)
Immediate Anesthesia Transfer of Care Note  Patient: Joseph Esparza  Procedure(s) Performed: XI ROBOTIC ASSISTED INGUINAL HERNIA (Left) INSERTION OF MESH  Patient Location: PACU  Anesthesia Type:General  Level of Consciousness: awake and patient cooperative  Airway & Oxygen Therapy: Patient Spontanous Breathing and Patient connected to nasal cannula oxygen  Post-op Assessment: Report given to RN and Post -op Vital signs reviewed and stable  Post vital signs: Reviewed and stable  Last Vitals:  Vitals Value Taken Time  BP 144/73 03/11/22 1337  Temp 36.3 C 03/11/22 1337  Pulse 58 03/11/22 1340  Resp 20 03/11/22 1340  SpO2 99 % 03/11/22 1340  Vitals shown include unvalidated device data.  Last Pain:  Vitals:   03/11/22 0959  TempSrc: Oral  PainSc: 0-No pain         Complications: No notable events documented.

## 2022-03-11 NOTE — Progress Notes (Signed)
Patient says that stomach is sore, but is refusing pain medication at this time. Will continue to assess.

## 2022-03-11 NOTE — Op Note (Signed)
Robotic assisted Laparoscopic Transabdominal Left Inguinal Hernia Repair with Mesh       Pre-operative Diagnosis:  Left Indirect Inguinal Hernia   Post-operative Diagnosis: Same   Procedure: Robotic assisted Laparoscopic  repair of left inguinal hernia(s)   Surgeon: Ronny Bacon, M.D., FACS   Anesthesia: GETA   Findings: Left indirect inguinal hernia, no  evidence of right sided hernia.         Procedure Details  The patient was seen again in the Holding Room. The benefits, complications, treatment options, and expected outcomes were discussed with the patient. The risks of bleeding, infection, recurrence of symptoms, failure to resolve symptoms, recurrence of hernia, ischemic orchitis, chronic pain syndrome or neuroma, were reviewed again. The likelihood of improving the patient's symptoms with return to their baseline status is good.  The patient and/or family concurred with the proposed plan, giving informed consent.  The patient was taken to Operating Room, identified  and the procedure verified as Laparoscopic Inguinal Hernia Repair. Laterality confirmed.  A Time Out was held and the above information confirmed.   Prior to the induction of general anesthesia, antibiotic prophylaxis was administered. VTE prophylaxis was in place. General endotracheal anesthesia was then administered and tolerated well. After the induction, the abdomen was prepped with Chloraprep and draped in the sterile fashion. The patient was positioned in the supine position.   After local infiltration of quarter percent Marcaine with epinephrine, stab incision was made left upper quadrant.  On the left at Palmer's point, the Veress needle is passed with sensation of the layers to penetrate the abdominal wall and into the peritoneum.  Saline drop test is confirmed peritoneal placement.  Insufflation is initiated with carbon dioxide to pressures of 15 mmHg. An 8.5 mm port is placed to the left off of the midline,  with blunt tipped trocar.  Pneumoperitoneum maintained w/o HD changes using the AirSeal to pressures of 15 mm Hg with CO2. No evidence of bowel injuries.  Two 8.5 mm ports placed under direct vision in each upper quadrant. The laparoscopy revealed left indirect defect(s).   Under direct visualization I placed the Veress needle into the preperitoneal space and pre- insufflated this space. The robot was brought ot the table and docked in the standard fashion, no collision between arms was observed. Instruments were kept under direct view at all times. For left inguinal hernia repair,  I developed a peritoneal flap. The sac(s) were reduced and dissected free from adjacent structures. We preserved the vas and the vessels, and visualized them to their convergence and beyond in the retroperitoneum. Once dissection was completed a large left sided BARD 3D Light mesh was placed and secured at three points with interrupted 0 Vicryl to the pubic tubercle and anteriorly. There was good coverage of the direct, indirect and femoral spaces.  Second look revealed no complications or injuries.  The flap was then closed with 2-0 V-lock suture.  Peritoneal closure without defects.  The Veress' was placed and the valve was released allowing extraperitoneal CO2 to escape. Once assuring that hemostasis was adequate, all needles/sponges removed, and the robot was undocked.  The ports were removed, the abdomen desulflated.  4-0 subcuticular Monocryl was used at all skin edges. Dermabond was placed.  Patient tolerated the procedure well. There were no complications. He was taken to the recovery room in stable condition.           Ronny Bacon, M.D., FACS 03/11/2022, 1:34 PM

## 2022-03-12 ENCOUNTER — Encounter: Payer: Self-pay | Admitting: Surgery

## 2022-03-12 NOTE — Anesthesia Postprocedure Evaluation (Signed)
Anesthesia Post Note  Patient: [name]  Procedure(s) Performed: XI ROBOTIC ASSISTED INGUINAL HERNIA (Left) INSERTION OF MESH  Patient location during evaluation: PACU Anesthesia Type: General Level of consciousness: awake and alert Pain management: pain level controlled Vital Signs Assessment: post-procedure vital signs reviewed and stable Respiratory status: spontaneous breathing, nonlabored ventilation, respiratory function stable and patient connected to nasal cannula oxygen Cardiovascular status: blood pressure returned to baseline and stable Postop Assessment: no apparent nausea or vomiting Anesthetic complications: no   No notable events documented.   Last Vitals:  Vitals:   03/11/22 1400 03/11/22 1412  BP: (!) 156/81 (!) 157/79  Pulse: 60 62  Resp: 13 15  Temp:  (!) 36.1 C  SpO2: 100% 97%    Last Pain:  Vitals:   03/12/22 0841  TempSrc:   PainSc: 2                  Martha Clan

## 2022-03-26 ENCOUNTER — Ambulatory Visit (INDEPENDENT_AMBULATORY_CARE_PROVIDER_SITE_OTHER): Payer: Medicare Other | Admitting: Physician Assistant

## 2022-03-26 ENCOUNTER — Encounter: Payer: Self-pay | Admitting: Physician Assistant

## 2022-03-26 VITALS — BP 118/78 | HR 79 | Temp 98.4°F | Wt 171.2 lb

## 2022-03-26 DIAGNOSIS — Z09 Encounter for follow-up examination after completed treatment for conditions other than malignant neoplasm: Secondary | ICD-10-CM

## 2022-03-26 DIAGNOSIS — K4090 Unilateral inguinal hernia, without obstruction or gangrene, not specified as recurrent: Secondary | ICD-10-CM

## 2022-03-26 NOTE — Progress Notes (Signed)
[facility] POST-OP OFFICE VISIT  03/26/2022  HPI: Joseph Esparza is a 76 y.o. male 15 days s/p robotic assisted laparoscopic left inguinal hernia repair with Dr Christian Mate   He is doing remarkably well He reports that he has been essentially pain free since the operation No fever, chills, nausea, emesis, or bowel changes No issues with incisions No other complaints   Vital signs: BP 118/78   Pulse 79   Temp 98.4 F (36.9 C) (Oral)   Wt 171 lb 3.2 oz (77.7 kg)   SpO2 97%   BMI 26.03 kg/m    Physical Exam: Constitutional: Well appearing male, NAD Abdomen: Soft, non-tender, non-distended, no rebound/guarding Skin: Laparoscopic incisions are healing well, no erythema or drainage   Assessment/Plan: This is a 76 y.o. male 15 days s/p robotic assisted laparoscopic left inguinal hernia repair    - Pain control prn  - Reviewed wound care recommendation  - Reviewed lifting restrictions; 6 weeks total  - He can follow up on as needed basis; He understands to call with questions/concerns  -- Edison Simon, PA-C [facility] 03/26/2022, 2:30 PM M-F: 7am - 4pm

## 2022-03-26 NOTE — Patient Instructions (Signed)

## 2022-06-08 ENCOUNTER — Other Ambulatory Visit: Payer: Self-pay

## 2022-06-08 ENCOUNTER — Encounter: Payer: Self-pay | Admitting: Emergency Medicine

## 2022-06-08 ENCOUNTER — Emergency Department
Admission: EM | Admit: 2022-06-08 | Discharge: 2022-06-08 | Disposition: A | Discharge status: Home / Self Care | Payer: Medicare Other | Attending: Emergency Medicine | Admitting: Emergency Medicine

## 2022-06-08 DIAGNOSIS — Z79899 Other long term (current) drug therapy: Secondary | ICD-10-CM | POA: Diagnosis not present

## 2022-06-08 DIAGNOSIS — Z7982 Long term (current) use of aspirin: Secondary | ICD-10-CM | POA: Insufficient documentation

## 2022-06-08 DIAGNOSIS — I2510 Atherosclerotic heart disease of native coronary artery without angina pectoris: Secondary | ICD-10-CM | POA: Diagnosis not present

## 2022-06-08 DIAGNOSIS — I509 Heart failure, unspecified: Secondary | ICD-10-CM | POA: Insufficient documentation

## 2022-06-08 DIAGNOSIS — R339 Retention of urine, unspecified: Secondary | ICD-10-CM | POA: Insufficient documentation

## 2022-06-08 DIAGNOSIS — I110 Hypertensive heart disease with heart failure: Secondary | ICD-10-CM | POA: Insufficient documentation

## 2022-06-08 DIAGNOSIS — R338 Other retention of urine: Secondary | ICD-10-CM

## 2022-06-08 LAB — URINALYSIS, ROUTINE W REFLEX MICROSCOPIC
Bacteria, UA: NONE SEEN
Bilirubin Urine: NEGATIVE
Glucose, UA: NEGATIVE mg/dL
Ketones, ur: NEGATIVE mg/dL
Leukocytes,Ua: NEGATIVE
Nitrite: NEGATIVE
Protein, ur: NEGATIVE mg/dL
Specific Gravity, Urine: 1.014 (ref 1.005–1.030)
pH: 5 (ref 5.0–8.0)

## 2022-06-08 MED ORDER — TAMSULOSIN HCL 0.4 MG PO CAPS: 0.4000 mg | ORAL_CAPSULE | Freq: Every day | ORAL | 0 refills | Status: DC

## 2022-06-08 NOTE — ED Provider Notes (Signed)
[facility] Provider Note    Event Date/Time   First MD Initiated Contact with Patient 06/08/22 [phone]     (approximate)   History   Urinary Retention   HPI  Joseph Esparza is a 76 y.o. male with history of BPH, CAD, CHF, hypertension, hyperlipidemia who presents emergency department urinary retention.  States the last time he was able to urinate normally was 2 days ago.  Since then it has only been small dribbles.  Does not notice any hematuria.  He has had urinary retention before requiring a Foley catheter.  No fevers or vomiting.   History provided by patient.    Past Medical History:  Diagnosis Date   Anginal pain ([place])    BPH (benign prostatic hyperplasia)    Cardiomyopathy ([place]) 05/01/2017   a.) STEMI 05/01/2017 --> STEMI --> EF 35-45%. b.) TTE 05/02/2017: EF 40-45%. c.) TTE 09/02/2017: EF 45%.   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 ([place]) 05/01/2017   a.) LHC 05/01/2017: EF 35-45%; anteroapical AK; 100% mLAD --> PCI placing a 3.25 x 23 mm Xience Alpine DES x 1    Past Surgical History:  Procedure Laterality Date   COLONOSCOPY WITH PROPOFOL N/A 02/03/2021   Procedure: COLONOSCOPY WITH PROPOFOL;  Surgeon: Lesly Rubenstein, MD;  Location: [facility] ENDOSCOPY;  Service: Endoscopy;  Laterality: N/A;   CORONARY BALLOON ANGIOPLASTY N/A 05/01/2017   Procedure: CORONARY BALLOON ANGIOPLASTY;  Surgeon:  Yolonda Kida, MD;  Location: [place] [facility];  Service: Cardiovascular;  Laterality: N/A;   CORONARY/GRAFT ACUTE MI REVASCULARIZATION N/A 05/01/2017   Procedure: Coronary/Graft Acute MI Revascularization;  Surgeon: Yolonda Kida, MD;  Location: [place] [facility];  Service: Cardiovascular;  Laterality: N/A;   INSERTION OF MESH  03/11/2022   Procedure: INSERTION OF MESH;  Surgeon: Ronny Bacon, MD;  Location: [facility] ORS;  Service: General;;   LEFT HEART CATH AND CORONARY ANGIOGRAPHY N/A 05/01/2017   Procedure: LEFT HEART CATH AND CORONARY ANGIOGRAPHY;  Surgeon: Yolonda Kida, MD;  Location: [place] [facility];  Service: Cardiovascular;  Laterality: N/A;   VASECTOMY N/A     MEDICATIONS:  Prior to Admission medications   Medication Sig Start Date End Date Taking? Authorizing Provider  aspirin 81 MG chewable tablet Chew 1 tablet (81 mg total) by mouth daily. 05/04/17   Flora Lipps, MD  atorvastatin (LIPITOR) 80 MG tablet Take 1 tablet (80 mg total) by mouth daily at 6 PM. Patient taking differently: Take 80 mg by mouth every morning. 05/04/17   Flora Lipps, MD  latanoprost (XALATAN) 0.005 % ophthalmic solution Place 1 drop into both eyes at bedtime.    [provider]  losartan (COZAAR) 25 MG tablet Take 25 mg by mouth every morning.    [provider]  metoprolol tartrate (LOPRESSOR) 25 MG  tablet Take 0.5 tablets (12.5 mg total) by mouth 2 (two) times daily. Patient taking differently: Take 25 mg by mouth every morning. 05/04/17   Flora Lipps, MD    Physical Exam   Triage Vital Signs: ED Triage Vitals  Enc Vitals Group     BP 06/08/22 0349 (!) 174/105     Pulse Rate 06/08/22 0349 88     Resp 06/08/22 0349 16     Temp 06/08/22 0349 97.6 F (36.4 C)     Temp Source 06/08/22 0349 Oral     SpO2 06/08/22 0349 92 %     Weight 06/08/22 0350 170 lb (77.1 kg)     Height 06/08/22 0350 '5\' 8"'$  (1.727 m)     Head Circumference --      Peak Flow --       Pain Score 06/08/22 0350 8     Pain Loc --      Pain Edu? --      Excl. in [place]? --     Most recent vital signs: Vitals:   06/08/22 0349 06/08/22 0415  BP: (!) 174/105 (!) 150/88  Pulse: 88 92  Resp: 16 17  Temp: 97.6 F (36.4 C)   SpO2: 92% 94%    CONSTITUTIONAL: Alert and oriented and responds appropriately to questions.  Elderly, moaning in pain HEAD: Normocephalic, atraumatic EYES: Conjunctivae clear, pupils appear equal, sclera nonicteric ENT: normal nose; moist mucous membranes NECK: Supple, normal ROM CARD: RRR; S1 and S2 appreciated; no murmurs, no clicks, no rubs, no gallops RESP: Normal chest excursion without splinting or tachypnea; breath sounds clear and equal bilaterally; no wheezes, no rhonchi, no rales, no hypoxia or respiratory distress, speaking full sentences ABD/GI: Normal bowel sounds; distended with over 1000 mL seen on bladder scan, tender in the suprapubic area BACK: The back appears normal EXT: Normal ROM in all joints; no deformity noted, no edema; no cyanosis SKIN: Normal color for age and race; warm; no rash on exposed skin NEURO: Moves all extremities equally, normal speech PSYCH: The patient's mood and manner are appropriate.   ED Results / Procedures / Treatments   LABS: (all labs ordered are listed, but only abnormal results are displayed) Labs Reviewed  URINALYSIS, ROUTINE W REFLEX MICROSCOPIC - Abnormal; Notable for the following components:      Result Value   Color, Urine YELLOW (*)    APPearance CLEAR (*)    Hgb urine dipstick LARGE (*)    All other components within normal limits  URINE CULTURE     EKG:   RADIOLOGY: My personal review and interpretation of imaging:    I have personally reviewed all radiology reports.   No results found.   PROCEDURES:  Critical Care performed: No     Procedures    IMPRESSION / MDM / ASSESSMENT AND PLAN / ED COURSE  I reviewed the triage vital signs and the nursing  notes.    Patient here with acute urinary retention likely from BPH.    DIFFERENTIAL DIAGNOSIS (includes but not limited to):   Retention from BPH.  Differential also includes UTI.  Doubt kidney stone or pyelonephritis.  Doubt neurogenic retention.   Patient's presentation is most consistent with acute presentation with potential threat to life or bodily function.   PLAN: Bladder scan showed over 1000 mL in his bladder.  Foley catheter placed without difficulty.  And instant relief and drainage of straw-colored urine.  We will send urinalysis and urine culture.  Anticipate discharge home  with urology follow-up for voiding trial and having catheter removed as an outpatient.  Will discharge on Flomax.   MEDICATIONS GIVEN IN ED: Medications - No data to display   ED COURSE: Urinalysis shows a small amount of red blood cells likely from being catheterized but no other sign of infection.  Culture pending.  Will discharge with urology follow-up and outpatient Flomax.  Patient is much more comfortable.  Blood pressure has improved after catheter placement.   At this time, I do not feel there is any life-threatening condition present. I reviewed all nursing notes, vitals, pertinent previous records.  All lab and urine results, EKGs, imaging ordered have been independently reviewed and interpreted by myself.  I reviewed all available radiology reports from any imaging ordered this visit.  Based on my assessment, I feel the patient is safe to be discharged home without further emergent workup and can continue workup as an outpatient as needed. Discussed all findings, treatment plan as well as usual and customary return precautions.  They verbalize understanding and are comfortable with this plan.  Outpatient follow-up has been provided as needed.  All questions have been answered.    CONSULTS:  none   OUTSIDE RECORDS REVIEWED: Reviewed patient's last PCP note on 02/19/2022 at  [facility].       FINAL CLINICAL IMPRESSION(S) / ED DIAGNOSES   Final diagnoses:  Acute urinary retention     Rx / DC Orders   ED Discharge Orders          Ordered    tamsulosin (FLOMAX) 0.4 MG CAPS capsule  Daily        06/08/22 0405             Note:  This document was prepared using Dragon voice recognition software and may include unintentional dictation errors.   Zorawar Strollo, Delice Bison, DO 06/08/22 0425

## 2022-06-08 NOTE — ED Triage Notes (Signed)
Pt arrived via POV with reports of urinary retention for a couple of nights, now requesting a catheter.

## 2022-06-09 LAB — URINE CULTURE: Culture: NO GROWTH

## 2022-06-17 ENCOUNTER — Ambulatory Visit: Payer: Self-pay | Admitting: Urology

## 2022-06-18 ENCOUNTER — Ambulatory Visit (INDEPENDENT_AMBULATORY_CARE_PROVIDER_SITE_OTHER): Payer: Medicare Other | Admitting: Physician Assistant

## 2022-06-18 ENCOUNTER — Encounter: Payer: Self-pay | Admitting: Urology

## 2022-06-18 ENCOUNTER — Ambulatory Visit (INDEPENDENT_AMBULATORY_CARE_PROVIDER_SITE_OTHER): Payer: Medicare Other | Admitting: Urology

## 2022-06-18 VITALS — BP 135/82 | HR 73 | Ht 68.0 in | Wt 170.0 lb

## 2022-06-18 DIAGNOSIS — N401 Enlarged prostate with lower urinary tract symptoms: Secondary | ICD-10-CM

## 2022-06-18 DIAGNOSIS — R338 Other retention of urine: Secondary | ICD-10-CM

## 2022-06-18 DIAGNOSIS — R972 Elevated prostate specific antigen [PSA]: Secondary | ICD-10-CM | POA: Diagnosis not present

## 2022-06-18 LAB — BLADDER SCAN AMB NON-IMAGING

## 2022-06-18 MED ORDER — TAMSULOSIN HCL 0.4 MG PO CAPS: 0.4000 mg | ORAL_CAPSULE | Freq: Every day | ORAL | 0 refills | Status: DC

## 2022-06-18 NOTE — Progress Notes (Signed)
Afternoon follow-up  Patient returned to clinic this afternoon for repeat PVR. He reports drinking approximately 16oz of fluid. He has not urinated, nor has he felt the urge to. Bladder scan with 263m.  Results for orders placed or performed in visit on 06/18/22  Bladder Scan (Post Void Residual) in office  Result Value Ref Range   Scan Result 226ML     Voiding trial equivocal. I offered him Foley replacement versus repeat PVR tomorrow versus PRN follow up and he elected for the latter. I counseled him to proceed to the ED if he has recurrent retention outside of office hours; otherwise ok to call [facility] during the week.  Follow up: 1 month with Dr. SBernardo Heater

## 2022-06-18 NOTE — Progress Notes (Signed)
06/18/2022 10:01 AM   Joseph Esparza [birth year] [id number]  Referring provider: Barbaraann Boys, MD [address] [place],  [place] [id number]  Chief Complaint  Patient presents with   Urinary Retention    HPI: Joseph Esparza is a 76 y.o. male who presents in follow-up of recent ED visit for urinary retention.  Presented to [facility] ED 06/08/2022 in the early morning hours with progressive obstructive voiding symptoms and subsequent urinary retention.  Bladder scan volume was >1000 mL.  A Foley catheter was placed with return of 1 L of urine with instant relief of his symptoms.  He was discharged with an indwelling Foley catheter and Rx for tamsulosin Urine culture was negative He has had a previous episode of postoperative urinary retention and one other episode which was treated with in/out catheterization Denies prior urologic evaluation but on record review was seen by urology at [facility] January 2019 for a PSA of 8.88.  Prior negative biopsy 7-8 years prior to that visit.  A PHI was ordered which was low at 24.8 and he elected surveillance.  Follow-up PHI 07/2018 was 42.0 (33.3% probability of cancer) Last PSA December 2021 at 9.69   PMH: Past Medical History:  Diagnosis Date   Anginal pain ([facility])    BPH (benign prostatic hyperplasia)    Cardiomyopathy ([place]) 05/01/2017   a.) STEMI 05/01/2017 --> STEMI --> EF 35-45%. b.) TTE 05/02/2017: EF 40-45%. c.) TTE 09/02/2017: EF 45%.   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 ([place]) 05/01/2017   a.) LHC 05/01/2017: EF 35-45%; anteroapical AK; 100% mLAD --> PCI placing a 3.25 x 23 mm Xience Alpine DES x 1    Surgical History: Past Surgical History:  Procedure Laterality Date   COLONOSCOPY WITH PROPOFOL N/A 02/03/2021   Procedure: COLONOSCOPY WITH PROPOFOL;  Surgeon: Lesly Rubenstein, MD;  Location: [facility] ENDOSCOPY;  Service: Endoscopy;  Laterality: N/A;   CORONARY BALLOON ANGIOPLASTY N/A 05/01/2017   Procedure: CORONARY BALLOON ANGIOPLASTY;  Surgeon: Yolonda Kida, MD;  Location: [place] [facility];  Service: Cardiovascular;  Laterality: N/A;   CORONARY/GRAFT ACUTE MI REVASCULARIZATION N/A 05/01/2017   Procedure: Coronary/Graft Acute MI Revascularization;  Surgeon: Yolonda Kida, MD;  Location: [place] [facility];  Service: Cardiovascular;  Laterality: N/A;   INSERTION OF MESH  03/11/2022   Procedure: INSERTION OF MESH;  Surgeon: Ronny Bacon, MD;  Location: [facility] ORS;  Service: General;;   LEFT HEART CATH AND CORONARY ANGIOGRAPHY N/A 05/01/2017   Procedure: LEFT HEART CATH AND CORONARY ANGIOGRAPHY;  Surgeon: Yolonda Kida, MD;  Location: [place] [facility];  Service: Cardiovascular;  Laterality: N/A;   VASECTOMY N/A     Home Medications:  Allergies as of 06/18/2022   No Known Allergies      Medication List  Accurate as of June 18, 2022 10:01 AM. If you have any questions, ask your nurse or doctor.          aspirin 81 MG chewable tablet Chew 1 tablet (81 mg total) by mouth daily.   atorvastatin 80 MG tablet Commonly known as: LIPITOR Take 1 tablet (80 mg total) by mouth daily at 6 PM. What changed: when to take this   latanoprost 0.005 % ophthalmic solution Commonly known as: XALATAN Place 1 drop into both eyes at bedtime.   losartan 25 MG tablet Commonly known as: COZAAR Take 25 mg by mouth every morning.   metoprolol tartrate  25 MG tablet Commonly known as: LOPRESSOR Take 0.5 tablets (12.5 mg total) by mouth 2 (two) times daily. What changed:  how much to take when to take this   tamsulosin 0.4 MG Caps capsule Commonly known as: Flomax Take 1 capsule (0.4 mg total) by mouth daily.        Allergies: No Known Allergies  Family History: Family History  Problem Relation Age of Onset   Heart disease Mother    Heart disease Father     Social History:  reports that he quit smoking about 58 years ago. His smoking use included cigarettes. He has a 5.00 pack-year smoking history. He has never used smokeless tobacco. He reports current alcohol use of about 3.0 standard drinks of alcohol per week. He reports that he does not use drugs.   Physical Exam: BP 135/82   Pulse 73   Ht '5\' 8"'$  (1.727 m)   Wt 170 lb (77.1 kg)   BMI 25.85 kg/m   Constitutional:  Alert and oriented, No acute distress. HEENT: [place] AT Respiratory: Normal respiratory effort, no increased work of breathing. Psychiatric: Normal mood and affect.   Assessment & Plan:    1.  BPH with urinary retention Catheter was removed and he has a follow-up visit scheduled this afternoon for bladder scan If successful voiding trial will see him back in 1 month for repeat bladder scan and DRE Tamsulosin was refilled  2.  Elevated PSA Last PSA was 08/2020.  Will discuss options in detail at next visit.   Abbie Sons, [place] [address], [place], [place] [address] [phone]

## 2022-06-18 NOTE — Progress Notes (Signed)
Catheter Removal  Patient is present today for a catheter removal.  74m of water was drained from the balloon. A 16FR foley cath was removed from the bladder, no complications were noted. Patient tolerated well.  Performed by: JGaspar ColaCMA

## 2022-06-19 ENCOUNTER — Encounter: Payer: Self-pay | Admitting: Emergency Medicine

## 2022-06-19 ENCOUNTER — Emergency Department
Admission: EM | Admit: 2022-06-19 | Discharge: 2022-06-19 | Disposition: A | Discharge status: Home / Self Care | Payer: Medicare Other | Attending: Emergency Medicine | Admitting: Emergency Medicine

## 2022-06-19 DIAGNOSIS — R339 Retention of urine, unspecified: Secondary | ICD-10-CM | POA: Diagnosis present

## 2022-06-19 NOTE — Discharge Instructions (Signed)
You were seen in the Emergency Department (ED) for urinary retention.  We placed a Foley catheter to help your bladder drain.  Please read through the included information and follow up with your doctor as recommended in these papers; your doctor will see you in clinic and help you determine when it is time to have the catheter removed.  If you stop producing urine in the bag or if you develop other symptoms that concern you, such as fever, chills, persistent vomiting, or severe abdominal pain, please return immediately to the Emergency Department.  ° °

## 2022-06-19 NOTE — ED Triage Notes (Signed)
Pt presents via POV with complaints of urinary retention starting this AM after having his foley removed after a 10 days. Pt states the foley was removed yesterday at 0900 and he hasn't voided since.  Denies CP or SOB.    Bladder scan amount 718m

## 2022-06-19 NOTE — ED Provider Notes (Signed)
[facility] Provider Note    Event Date/Time   First MD Initiated Contact with Patient 06/19/22 [phone]     (approximate)   History   Urinary Retention   HPI  KYSHON TOLLIVER is a 76 y.o. male who presents for evaluation of urinary retention.  He has prostate issues and had an indwelling Foley catheter for about 10 days.  It was just removed at [facility] yesterday.  He had a follow-up in the afternoon and there was some concern that he was not voiding, but he wanted to continue without the Foley.  They warned him he may need to come back or go to the emergency department if he is unable to urinate.  Over the next few hours and particular overnight he started to develop a great deal of pressure and pain in his lower abdomen and had not been able to urinate for more than 12 hours.  Bladder scan in the emergency department demonstrated more than 700 mL.     Physical Exam   Triage Vital Signs: ED Triage Vitals  Enc Vitals Group     BP 06/19/22 0300 (!) 157/94     Pulse Rate 06/19/22 0300 95     Resp 06/19/22 0300 18     Temp 06/19/22 0300 97.7 F (36.5 C)     Temp Source 06/19/22 0300 Oral     SpO2 06/19/22 0300 99 %     Weight 06/19/22 0259 78 kg (171 lb 15.3 oz)     Height 06/19/22 0259 1.727 m ('5\' 8"'$ )     Head Circumference --      Peak Flow --      Pain Score --      Pain Loc --      Pain Edu? --      Excl. in [place]? --     Most recent vital signs: Vitals:   06/19/22 0300 06/19/22 0330  BP: (!) 157/94 134/88  Pulse: 95 80  Resp: 18   Temp: 97.7 F (36.5 C)   SpO2: 99% 99%     General: Awake, no distress after replacement of Foley catheter. CV:  Good peripheral perfusion.  Resp:  Normal effort.  Abd:  No distention.  No tenderness to palpation of the lower abdomen. Other:  Foley catheter in place draining clear yellow urine.  No blood is evident.   ED Results / Procedures / Treatments   Labs (all labs ordered are  listed, but only abnormal results are displayed) Labs Reviewed - No data to display    PROCEDURES:  Critical Care performed: No  Procedures   MEDICATIONS ORDERED IN ED: Medications - No data to display   IMPRESSION / MDM / [place] / ED COURSE  I reviewed the triage vital signs and the nursing notes.                              Differential diagnosis includes, but is not limited to, urinary retention, UTI, BPH or other prostate issue.  Patient's presentation is most consistent with severe exacerbation of chronic illness.  Given the large amount of urine seen on bladder scan, I ordered placement of a Foley catheter.  Approximately 1100 mL of urine has drained.  Patient feels complete relief.  No indication for urinalysis or urine culture.  I sent a [facility] message to urology to let them know about his visit to try and  help coordinate follow-up appointment.  Patient is comfortable going home with leg bag.  I gave my usual and customary follow-up recommendations and return precautions.       FINAL CLINICAL IMPRESSION(S) / ED DIAGNOSES   Final diagnoses:  Urinary retention     Rx / DC Orders   ED Discharge Orders     None        Note:  This document was prepared using Dragon voice recognition software and may include unintentional dictation errors.   Hinda Kehr, MD 06/19/22 [phone]

## 2022-06-30 NOTE — Progress Notes (Unsigned)
07/01/2022 9:34 AM   Joseph Esparza [birth year] [id number]  Referring provider: Barbaraann Boys, MD [place],  [place] [address]  Urological history: 1. Elevated PSA -prostate biopsy ~ 2010 - negative  -PSA @ [facility] (09/2017) 8.88 -PHI (09/2017) 24.8 -PHI (01/2018) 34.7 -PHI (07/2018) 42.0 -most recent PSA (08/2020) 9.69  2. Urinary retention -ED (06/08/2022) retention - 1 L after Foley, put on tamsulosin  -failed TOV (06/19/2022) - seen in ED for 1 L -has had previous episodes of post-operative retention treated w/Foley and CIC  No chief complaint on file.   HPI: Joseph Esparza is a 76 y.o. male who presents today for his second voiding trial.       PMH: Past Medical History:  Diagnosis Date   Anginal pain ([place])    BPH (benign prostatic hyperplasia)    Cardiomyopathy ([place]) 05/01/2017   a.) STEMI 05/01/2017 --> STEMI --> EF 35-45%. b.) TTE 05/02/2017: EF 40-45%. c.) TTE 09/02/2017: EF 45%.   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 ([place]) 05/01/2017   a.) LHC 05/01/2017: EF 35-45%; anteroapical AK; 100% mLAD --> PCI placing a 3.25 x 23 mm Xience Alpine DES x 1    Surgical History: Past Surgical History:  Procedure Laterality Date   COLONOSCOPY WITH PROPOFOL N/A 02/03/2021   Procedure: COLONOSCOPY WITH PROPOFOL;  Surgeon: Lesly Rubenstein, MD;  Location: [facility];  Service: Endoscopy;  Laterality: N/A;    CORONARY BALLOON ANGIOPLASTY N/A 05/01/2017   Procedure: CORONARY BALLOON ANGIOPLASTY;  Surgeon: Yolonda Kida, MD;  Location: [place] [facility];  Service: Cardiovascular;  Laterality: N/A;   CORONARY/GRAFT ACUTE MI REVASCULARIZATION N/A 05/01/2017   Procedure: Coronary/Graft Acute MI Revascularization;  Surgeon: Yolonda Kida, MD;  Location: [place] [facility];  Service: Cardiovascular;  Laterality: N/A;   INSERTION OF MESH  03/11/2022   Procedure: INSERTION OF MESH;  Surgeon: Ronny Bacon, MD;  Location: [facility]S;  Service: General;;   LEFT HEART CATH AND CORONARY ANGIOGRAPHY N/A 05/01/2017   Procedure: LEFT HEART CATH AND CORONARY ANGIOGRAPHY;  Surgeon: Yolonda Kida, MD;  Location: [place] [facility];  Service: Cardiovascular;  Laterality: N/A;   VASECTOMY N/A     Home Medications:  Allergies as of 07/01/2022   No Known Allergies      Medication List        Accurate as of June 30, 2022  9:34 AM. If you have any questions, ask your nurse or doctor.          aspirin 81 MG chewable tablet Chew 1 tablet (81 mg total) by mouth daily.   atorvastatin 80 MG tablet Commonly known as: LIPITOR Take 1 tablet (80 mg total) by mouth daily at 6 PM. What changed: when to take this   latanoprost 0.005 % ophthalmic solution Commonly  known as: XALATAN Place 1 drop into both eyes at bedtime.   losartan 25 MG tablet Commonly known as: COZAAR Take 25 mg by mouth every morning.   metoprolol tartrate 25 MG tablet Commonly known as: LOPRESSOR Take 0.5 tablets (12.5 mg total) by mouth 2 (two) times daily. What changed:  how much to take when to take this   tamsulosin 0.4 MG Caps capsule Commonly known as: Flomax Take 1 capsule (0.4 mg total) by mouth daily.        Allergies: No Known Allergies  Family History: Family History  Problem Relation Age of Onset   Heart disease Mother    Heart disease Father     Social History:  reports that he quit  smoking about 58 years ago. His smoking use included cigarettes. He has a 5.00 pack-year smoking history. He has never used smokeless tobacco. He reports current alcohol use of about 3.0 standard drinks of alcohol per week. He reports that he does not use drugs.  ROS: Pertinent ROS in HPI  Physical Exam: There were no vitals taken for this visit.  Constitutional:  Well nourished. Alert and oriented, No acute distress. HEENT: [place] AT, moist mucus membranes.  Trachea midline, no masses. Cardiovascular: No clubbing, cyanosis, or edema. Respiratory: Normal respiratory effort, no increased work of breathing. GI: Abdomen is soft, non tender, non distended, no abdominal masses. Liver and spleen not palpable.  No hernias appreciated.  Stool sample for occult testing is not indicated.   GU: No CVA tenderness.  No bladder fullness or masses.  Patient with circumcised/uncircumcised phallus. ***Foreskin easily retracted***  Urethral meatus is patent.  No penile discharge. No penile lesions or rashes. Scrotum without lesions, cysts, rashes and/or edema.  Testicles are located scrotally bilaterally. No masses are appreciated in the testicles. Left and right epididymis are normal. Rectal: Patient with  normal sphincter tone. Anus and perineum without scarring or rashes. No rectal masses are appreciated. Prostate is approximately *** grams, *** nodules are appreciated. Seminal vesicles are normal. Skin: No rashes, bruises or suspicious lesions. Lymph: No cervical or inguinal adenopathy. Neurologic: Grossly intact, no focal deficits, moving all 4 extremities. Psychiatric: Normal mood and affect.  Laboratory Data: N/A  Pertinent Imaging: ***   Assessment & Plan:  ***  1. Acute urinary retention ***  No follow-ups on file.  These notes generated with voice recognition software. I apologize for typographical errors.  [place], [place] [address]   [place], [place] [address] [phone]

## 2022-07-01 ENCOUNTER — Ambulatory Visit (INDEPENDENT_AMBULATORY_CARE_PROVIDER_SITE_OTHER): Payer: Medicare Other | Admitting: Urology

## 2022-07-01 ENCOUNTER — Encounter: Payer: Medicare Other | Admitting: Urology

## 2022-07-01 DIAGNOSIS — R972 Elevated prostate specific antigen [PSA]: Secondary | ICD-10-CM

## 2022-07-01 DIAGNOSIS — R338 Other retention of urine: Secondary | ICD-10-CM | POA: Diagnosis not present

## 2022-07-01 DIAGNOSIS — N401 Enlarged prostate with lower urinary tract symptoms: Secondary | ICD-10-CM | POA: Diagnosis not present

## 2022-07-01 LAB — BLADDER SCAN AMB NON-IMAGING

## 2022-07-01 NOTE — Progress Notes (Signed)
Catheter Removal  Patient is present today for a catheter removal.  56m of water was drained from the balloon. A 14FR foley cath was removed from the bladder, no complications were noted. Patient tolerated well.  Performed by: CBradly BienenstockCMA  Follow up/ Additional notes: RTC this afternoon for PVR.

## 2022-07-16 ENCOUNTER — Ambulatory Visit (INDEPENDENT_AMBULATORY_CARE_PROVIDER_SITE_OTHER): Payer: Medicare Other | Admitting: Urology

## 2022-07-16 ENCOUNTER — Encounter: Payer: Self-pay | Admitting: Urology

## 2022-07-16 VITALS — BP 149/74 | HR 85 | Ht 70.0 in | Wt 170.0 lb

## 2022-07-16 DIAGNOSIS — N401 Enlarged prostate with lower urinary tract symptoms: Secondary | ICD-10-CM

## 2022-07-16 DIAGNOSIS — R338 Other retention of urine: Secondary | ICD-10-CM

## 2022-07-16 LAB — MICROSCOPIC EXAMINATION

## 2022-07-16 LAB — URINALYSIS, COMPLETE
Bilirubin, UA: NEGATIVE
Glucose, UA: NEGATIVE
Ketones, UA: NEGATIVE
Nitrite, UA: NEGATIVE
Specific Gravity, UA: 1.025 (ref 1.005–1.030)
Urobilinogen, Ur: 0.2 mg/dL (ref 0.2–1.0)
pH, UA: 5 (ref 5.0–7.5)

## 2022-07-16 LAB — BLADDER SCAN AMB NON-IMAGING

## 2022-07-16 MED ORDER — TADALAFIL 5 MG PO TABS: 5.0000 mg | ORAL_TABLET | Freq: Every day | ORAL | 0 refills | Status: DC

## 2022-07-16 NOTE — Progress Notes (Signed)
Simple Catheter Placement  Due to urinary retention patient is present today for a foley cath placement.  Patient was cleaned and prepped in a sterile fashion with betadine and 2% lidocaine jelly was instilled into the urethra. A 16  FR foley catheter was inserted, urine return was noted  900 ml, urine was yellow, clear in color.  The balloon was filled with 10cc of sterile water.  A leg bag was attached for drainage. Patient was also given a night bag to take home and was given instruction on how to change from one bag to another.  Patient was given instruction on proper catheter care.  Patient tolerated well, no complications were noted   Performed by: Zara Council, PA-C   Additional notes/ Follow up: Patient is ready to proceed with a bladder outlet procedure.  He would like further discussion regarding his candidacy for a HoLEP.   He will have an appointment with either Dr. Erlene Quan or Dr. Diamantina Providence.

## 2022-07-16 NOTE — Progress Notes (Signed)
07/16/22  Chief Complaint  Patient presents with   Cysto     HPI: 76 y.o. year-old male with refractory urinary symptoms related to BPH who presents today to the office for cystoscopy and prostate sizing.  He has failed voiding trials x2 on tamsulosin   Please see previous notes for details.     Blood pressure (!) 149/74, pulse 85, height '5\' 10"'$  (1.778 m), weight 170 lb (77.1 kg).    Cystoscopy Procedure Note  Patient identification was confirmed, informed consent was obtained, and patient was prepped using Betadine solution.  Lidocaine jelly was administered per urethral meatus.    Preoperative abx where received prior to procedure.     Pre-Procedure: - Inspection reveals a normal caliber urethral meatus.  Procedure: The flexible cystoscope was introduced without difficulty - No urethral strictures/lesions are present. -Prominent lateral lobe enlargement with hypervascularity prostate  -Mild elevation bladder neck - Bilateral ureteral orifices identified - Bladder mucosa  reveals no ulcers, tumors, or lesions; suboptimal visualization - No bladder stones -Mild trabeculation  Retroflexion shows mild intravesical extension   Post-Procedure: - Patient tolerated the procedure well   Prostate transrectal ultrasound sizing   Informed consent was obtained after discussing risks/benefits of the procedure.  A time out was performed to ensure correct patient identity.   Pre-Procedure: -Transrectal probe was placed without difficulty -Transrectal Ultrasound performed revealing a 1-7 gm prostate measuring 5 x 6.5 x 6.3 cm (length) -No significant hypoechoic or median lobe noted      Assessment/ Plan: BPH with urinary retention Has failed voiding trials x2 and desired a repeat voiding trial today He inquired about other medications for BPH.  We discussed 5-ARI meds though these take 4-6 months to work.  Low-dose PDE 5 inhibitor was also discussed and he did desire to  start. Rx tadalafil 5 mg sent to pharmacy He will follow-up this afternoon for a bladder scan Surgical options were discussed.  Based on prostate size HoLEP would be his best option.  Not a UroLift candidate secondary to prostate size. Rezum and PAE were also discussed   Abbie Sons, MD

## 2022-07-24 ENCOUNTER — Ambulatory Visit: Payer: Medicare Other | Admitting: Urology

## 2022-07-28 ENCOUNTER — Ambulatory Visit (INDEPENDENT_AMBULATORY_CARE_PROVIDER_SITE_OTHER): Payer: Medicare Other | Admitting: Urology

## 2022-07-28 VITALS — BP 161/81 | HR 63 | Ht 70.0 in | Wt 170.0 lb

## 2022-07-28 DIAGNOSIS — R338 Other retention of urine: Secondary | ICD-10-CM | POA: Diagnosis not present

## 2022-07-28 DIAGNOSIS — N401 Enlarged prostate with lower urinary tract symptoms: Secondary | ICD-10-CM | POA: Diagnosis not present

## 2022-07-28 DIAGNOSIS — N138 Other obstructive and reflux uropathy: Secondary | ICD-10-CM

## 2022-07-28 NOTE — Patient Instructions (Signed)

## 2022-07-28 NOTE — Progress Notes (Signed)
07/28/2022 3:16 PM   [name] Feb 07, 1946 [id number]  Referring provider: Barbaraann Boys, MD [place],  [place] [address]  Chief Complaint  Patient presents with   Follow-up    HPI: 76 year old male with a personal history of pH and refractory urinary tension who presents today to discuss possibility HoLEP.  He has failed multiple voiding trials on Flomax and has an indwelling Foley catheter at this he underwent diagnostic evaluation with Dr. Bernardo Heater indicating TRUS volume of 107 cc without a median lobe.  Cystoscopically, he was noted to have prominent lateral lobes of prostate and elevated bladder neck with mild trabeculation.  Notably, he initially presented with massive urinary retention, greater than 1000 mL in his bladder.  His symptoms are longstanding.  He also has a personal history of postoperative urinary retention prior to the above.  He was previously [facility] at [facility], has a personal history of elevated PSA which has remained fairly stable at about 9 having elected surveillance for this.  Please see Dr. Dene Gentry notes for details.  He does have a significant cardiac history although was able to successfully undergo surgical procedure in the form of hernia recently.   PMH: Past Medical History:  Diagnosis Date   Anginal pain ([place])    BPH (benign prostatic hyperplasia)    Cardiomyopathy ([place]) 05/01/2017   a.) STEMI 05/01/2017 --> STEMI --> EF 35-45%. b.) TTE 05/02/2017: EF 40-45%. c.) TTE 09/02/2017: EF 45%.   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 ([place]) 05/01/2017   a.) LHC 05/01/2017: EF 35-45%; anteroapical AK; 100% mLAD --> PCI placing a 3.25 x 23 mm Xience Alpine DES x 1    Surgical History: Past Surgical History:  Procedure Laterality Date   COLONOSCOPY WITH PROPOFOL N/A 02/03/2021   Procedure: COLONOSCOPY WITH PROPOFOL;  Surgeon: Lesly Rubenstein, MD;  Location: [facility];  Service: Endoscopy;  Laterality: N/A;   CORONARY BALLOON ANGIOPLASTY N/A 05/01/2017   Procedure: CORONARY BALLOON ANGIOPLASTY;  Surgeon: Yolonda Kida, MD;  Location: [place] [facility];  Service: Cardiovascular;  Laterality: N/A;   CORONARY/GRAFT ACUTE MI REVASCULARIZATION N/A 05/01/2017   Procedure: Coronary/Graft Acute MI Revascularization;  Surgeon: Yolonda Kida, MD;  Location: [place] [facility];  Service: Cardiovascular;  Laterality: N/A;   INSERTION OF MESH  03/11/2022   Procedure: INSERTION OF MESH;  Surgeon: Ronny Bacon, MD;  Location: [facility]S;  Service: General;;   LEFT HEART CATH AND CORONARY ANGIOGRAPHY N/A 05/01/2017   Procedure: LEFT HEART CATH AND CORONARY ANGIOGRAPHY;  Surgeon: Yolonda Kida, MD;  Location: [place] [facility];  Service: Cardiovascular;  Laterality: N/A;   VASECTOMY N/A     Home Medications:  Allergies as of 07/28/2022   No Known Allergies      Medication List  Accurate as of July 28, 2022  3:16 PM. If you have any questions, ask your nurse or doctor.          aspirin 81 MG chewable tablet Chew 1 tablet (81 mg total) by mouth daily.   atorvastatin 80 MG tablet Commonly known as: LIPITOR Take 1 tablet (80 mg total) by mouth daily at 6 PM. What changed: when to take this   latanoprost 0.005 % ophthalmic solution Commonly known as: XALATAN Place 1 drop into both eyes at bedtime.   losartan 25 MG tablet Commonly known  as: COZAAR Take 25 mg by mouth every morning.   metoprolol tartrate 25 MG tablet Commonly known as: LOPRESSOR Take 0.5 tablets (12.5 mg total) by mouth 2 (two) times daily. What changed:  how much to take when to take this   tadalafil 5 MG tablet Commonly known as: CIALIS Take 1 tablet (5 mg total) by mouth daily.   tamsulosin 0.4 MG Caps capsule Commonly known as: Flomax Take 1 capsule (0.4 mg total) by mouth daily.        Allergies: No Known Allergies  Family History: Family History  Problem Relation Age of Onset   Heart disease Mother    Heart disease Father     Social History:  reports that he quit smoking about 58 years ago. His smoking use included cigarettes. He has a 5.00 pack-year smoking history. He has never used smokeless tobacco. He reports current alcohol use of about 3.0 standard drinks of alcohol per week. He reports that he does not use drugs.   Physical Exam: BP (!) 161/81   Pulse 63   Ht '5\' 10"'$  (1.778 m)   Wt 170 lb (77.1 kg)   BMI 24.39 kg/m   Constitutional:  Alert and oriented, No acute distress. HEENT: [place] AT, moist mucus membranes.  Trachea midline, no masses. Cardiovascular: No clubbing, cyanosis, or edema. Respiratory: Normal respiratory effort, no increased work of breathing. Skin: No rashes, bruises or suspicious lesions. Neurologic: Grossly intact, no focal deficits, moving all 4 extremities. Psychiatric: Normal mood and affect.  Laboratory Data: Lab Results  Component Value Date   WBC 11.9 (H) 05/03/2017   HGB 14.3 05/03/2017   HCT 42.3 05/03/2017   MCV 88.4 05/03/2017   PLT 188 05/03/2017    Lab Results  Component Value Date   CREATININE 0.86 05/03/2017    Lab Results  Component Value Date   HGBA1C 5.8 (H) 05/02/2017   Assessment & Plan:    1. BPH with urinary obstruction He has previously discussed his options with Dr. Bernardo Heater and is most interested in HoLEP.  We reviewed the surgery in detail today including the  preoperative, intraoperative, and postoperative course.  This will most likely be an outpatient procedure pending the degree of post op hematuria.  He will go home with catheter for a 1 week post op and then undergo voiding trial.  Risk of bleeding, infection, damage surrounding structures, injury to the bladder/ urethral, bladder neck contracture, ureteral stricture, retrograde ejaculation, stress/ urge incontinence which could be permanent, exacerbation of irritative voiding symptoms were all discussed in detail.    He will need cardiac clearance  He will need a preop UA/urine culture from his catheter.  2. Benign prostatic hyperplasia with urinary retention As above   Hollice Espy, MD  [facility] [address], [place], [place] [address] [phone]

## 2022-07-28 NOTE — H&P (View-Only) (Signed)
07/28/2022 3:16 PM   [name] 02-06-1946 [id number]  Referring provider: Barbaraann Boys, MD [place],  [place] [address]  Chief Complaint  Patient presents with   Follow-up    HPI: 76 year old male with a personal history of pH and refractory urinary tension who presents today to discuss possibility HoLEP.  He has failed multiple voiding trials on Flomax and has an indwelling Foley catheter at this he underwent diagnostic evaluation with Dr. Bernardo Heater indicating TRUS volume of 107 cc without a median lobe.  Cystoscopically, he was noted to have prominent lateral lobes of prostate and elevated bladder neck with mild trabeculation.  Notably, he initially presented with massive urinary retention, greater than 1000 mL in his bladder.  His symptoms are longstanding.  He also has a personal history of postoperative urinary retention prior to the above.  He was previously followed at [facility], has a personal history of elevated PSA which has remained fairly stable at about 9 having elected surveillance for this.  Please see Dr. Dene Gentry notes for details.  He does have a significant cardiac history although was able to successfully undergo surgical procedure in the form of hernia recently.   PMH: Past Medical History:  Diagnosis Date   Anginal pain ([place])    BPH (benign prostatic hyperplasia)    Cardiomyopathy ([place]) 05/01/2017   a.) STEMI 05/01/2017 --> STEMI --> EF 35-45%. b.) TTE 05/02/2017: EF 40-45%. c.) TTE 09/02/2017: EF 45%.   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 ([place]) 05/01/2017   a.) LHC 05/01/2017: EF 35-45%; anteroapical AK; 100% mLAD --> PCI placing a 3.25 x 23 mm Xience Alpine DES x 1    Surgical History: Past Surgical History:  Procedure Laterality Date   COLONOSCOPY WITH PROPOFOL N/A 02/03/2021   Procedure: COLONOSCOPY WITH PROPOFOL;  Surgeon: Lesly Rubenstein, MD;  Location: [facility];  Service: Endoscopy;  Laterality: N/A;   CORONARY BALLOON ANGIOPLASTY N/A 05/01/2017   Procedure: CORONARY BALLOON ANGIOPLASTY;  Surgeon: Yolonda Kida, MD;  Location: [place] [facility];  Service: Cardiovascular;  Laterality: N/A;   CORONARY/GRAFT ACUTE MI REVASCULARIZATION N/A 05/01/2017   Procedure: Coronary/Graft Acute MI Revascularization;  Surgeon: Yolonda Kida, MD;  Location: [place] [facility];  Service: Cardiovascular;  Laterality: N/A;   INSERTION OF MESH  03/11/2022   Procedure: INSERTION OF MESH;  Surgeon: Ronny Bacon, MD;  Location: [facility] ORS;  Service: General;;   LEFT HEART CATH AND CORONARY ANGIOGRAPHY N/A 05/01/2017   Procedure: LEFT HEART CATH AND CORONARY ANGIOGRAPHY;  Surgeon: Yolonda Kida, MD;  Location: [place] [facility];  Service: Cardiovascular;  Laterality: N/A;   VASECTOMY N/A     Home Medications:  Allergies as of 07/28/2022   No Known Allergies      Medication List  Accurate as of July 28, 2022  3:16 PM. If you have any questions, ask your nurse or doctor.          aspirin 81 MG chewable tablet Chew 1 tablet (81 mg total) by mouth daily.   atorvastatin 80 MG tablet Commonly known as: LIPITOR Take 1 tablet (80 mg total) by mouth daily at 6 PM. What changed: when to take this   latanoprost 0.005 % ophthalmic solution Commonly known as: XALATAN Place 1 drop into both eyes at bedtime.   losartan 25 MG tablet Commonly known  as: COZAAR Take 25 mg by mouth every morning.   metoprolol tartrate 25 MG tablet Commonly known as: LOPRESSOR Take 0.5 tablets (12.5 mg total) by mouth 2 (two) times daily. What changed:  how much to take when to take this   tadalafil 5 MG tablet Commonly known as: CIALIS Take 1 tablet (5 mg total) by mouth daily.   tamsulosin 0.4 MG Caps capsule Commonly known as: Flomax Take 1 capsule (0.4 mg total) by mouth daily.        Allergies: No Known Allergies  Family History: Family History  Problem Relation Age of Onset   Heart disease Mother    Heart disease Father     Social History:  reports that he quit smoking about 58 years ago. His smoking use included cigarettes. He has a 5.00 pack-year smoking history. He has never used smokeless tobacco. He reports current alcohol use of about 3.0 standard drinks of alcohol per week. He reports that he does not use drugs.   Physical Exam: BP (!) 161/81   Pulse 63   Ht '5\' 10"'$  (1.778 m)   Wt 170 lb (77.1 kg)   BMI 24.39 kg/m   Constitutional:  Alert and oriented, No acute distress. HEENT: [place] AT, moist mucus membranes.  Trachea midline, no masses. Cardiovascular: No clubbing, cyanosis, or edema. Respiratory: Normal respiratory effort, no increased work of breathing. Skin: No rashes, bruises or suspicious lesions. Neurologic: Grossly intact, no focal deficits, moving all 4 extremities. Psychiatric: Normal mood and affect.  Laboratory Data: Lab Results  Component Value Date   WBC 11.9 (H) 05/03/2017   HGB 14.3 05/03/2017   HCT 42.3 05/03/2017   MCV 88.4 05/03/2017   PLT 188 05/03/2017    Lab Results  Component Value Date   CREATININE 0.86 05/03/2017    Lab Results  Component Value Date   HGBA1C 5.8 (H) 05/02/2017   Assessment & Plan:    1. BPH with urinary obstruction He has previously discussed his options with Dr. Bernardo Heater and is most interested in HoLEP.  We reviewed the surgery in detail today including the  preoperative, intraoperative, and postoperative course.  This will most likely be an outpatient procedure pending the degree of post op hematuria.  He will go home with catheter for a 1 week post op and then undergo voiding trial.  Risk of bleeding, infection, damage surrounding structures, injury to the bladder/ urethral, bladder neck contracture, ureteral stricture, retrograde ejaculation, stress/ urge incontinence which could be permanent, exacerbation of irritative voiding symptoms were all discussed in detail.    He will need cardiac clearance  He will need a preop UA/urine culture from his catheter.  2. Benign prostatic hyperplasia with urinary retention As above   Hollice Espy, MD  [facility] [address] [place], [place] [address] [phone]

## 2022-07-29 ENCOUNTER — Other Ambulatory Visit: Payer: Self-pay | Admitting: Urology

## 2022-07-29 DIAGNOSIS — N138 Other obstructive and reflux uropathy: Secondary | ICD-10-CM

## 2022-07-29 NOTE — Progress Notes (Signed)
Surgical Physician Order Form [facility] [place]  * Scheduling expectation : Next Available  *Length of Case:   *Clearance needed: yes; cardiology  *Anticoagulation Instructions: Hold all anticoagulants  *Aspirin Instructions:  Prefer baby aspirin held unless felt necessary by cardiology then may continue  *Post-op visit Date/Instructions:  1 week voiding trial  *Diagnosis: BPH w/urinary obstruction  *Procedure:  HoLEP     Additional orders: N/A  -Admit type: OUTpatient  -Anesthesia: General  -VTE Prophylaxis Standing Order SCD's       Other:   -Standing Lab Orders Per Anesthesia    Lab other: UA&Urine Culture from Foley cath, will need appointment for this  -Standing Test orders EKG/Chest x-ray per Anesthesia       Test other:   - Medications:  Ancef 2gm IV  -Other orders:  N/A

## 2022-08-03 ENCOUNTER — Other Ambulatory Visit: Payer: Self-pay | Admitting: Urology

## 2022-08-04 ENCOUNTER — Telehealth: Payer: Self-pay

## 2022-08-04 NOTE — Telephone Encounter (Signed)
Tried calling patient to schedule surgery, no answer, left detailed message to call.

## 2022-08-05 ENCOUNTER — Telehealth: Payer: Self-pay

## 2022-08-05 NOTE — Progress Notes (Signed)
REQUEST FOR SURGICAL CLEARANCE       Date: Date: 08/05/22  Faxed to: Dr. Clayborn Bigness, MD  Surgeon: Dr. Hollice Espy, MD     Date of Surgery: 08/24/2022  Operation: Holmium Laser Enucleation of the Prostate   Anesthesia Type: General    Diagnosis: Benign Prostatic Hyperplasia with Urinary Obstruction  Patient Requires:   Cardiac / Vascular Clearance : Yes  Reason: Would ideally like for patient to be off his Aspirin prior to surgery unless felt necessary by Cardiology. How many days should he hold?    Risk Assessment:    Low   '[]'$       Moderate   '[]'$     High   '[]'$           This patient is optimized for surgery  YES '[]'$       NO   '[]'$    I recommend further assessment/workup prior to surgery. YES '[]'$      NO  '[]'$   Appointment scheduled for: _______________________   Further recommendations: ____________________________________     Physician Signature:__________________________________   Printed Name: ________________________________________   Date: _________________

## 2022-08-05 NOTE — Telephone Encounter (Signed)
I spoke with Joseph Esparza. We have discussed possible surgery dates and Monday November 27th, 2023 was agreed upon by all parties. Patient given information about surgery date, what to expect pre-operatively and post operatively.  We discussed that a Pre-Admission Testing office will be calling to set up the pre-op visit that will take place prior to surgery, and that these appointments are typically done over the phone with a Pre-Admissions RN.  Informed patient that our office will communicate any additional care to be provided after surgery. Patients questions or concerns were discussed during our call. Advised to call our office should there be any additional information, questions or concerns that arise. Patient verbalized understanding.

## 2022-08-05 NOTE — Progress Notes (Signed)
[facility] Posting Form   Surgery Date/Time: Date: 08/24/2022  Surgeon: Dr. Hollice Espy, MD  Surgery Location: Day Surgery  Inpt ( No  )   Outpt (Yes)   Obs ( No  )   Diagnosis: N40.1, N13.8 Benign Prostatic Hyperplasia with Urinary Obstruction  -CPT: 21117  Surgery: Holmium Laser Enucleation of the Prostate  Stop Anticoagulations: Yes, would prefer for patient to hold ASA will ask Dr. Clayborn Bigness his recommendations  Cardiac/Medical/Pulmonary Clearance needed: yes  Clearance needed from Dr: Clayborn Bigness  Clearance request sent on: Date: 08/05/22   *Orders entered into EPIC  Date: 08/05/22   *Case booked in EPIC  Date: 08/05/22  *Notified pt of Surgery: Date: 08/05/22  PRE-OP UA & CX: yes, will obtain in clinic on 08/10/2022 in [place] into Prior Authorization Work Fabio Bering Date: 08/05/22  Assistant/laser/rep:No

## 2022-08-07 NOTE — Progress Notes (Unsigned)
Cath Change/ Replacement  Patient is present today for a catheter change due to urinary retention.  8 ml of water was removed from the balloon, a 16 FR coude foley cath was removed without difficulty.  Patient was cleaned and prepped in a sterile fashion with betadine and 2% lidocaine jelly was instilled into the urethra. A 16 FR Coude foley cath was replaced into the bladder, no complications were noted. Urine return was noted 20 ml and urine was red in color. The balloon was filled with 10ml of sterile water. A leg bag was attached for drainage.  Patient was given proper instruction on catheter care.    Performed by: Michiel Cowboy, PA-C and Luther Hearing, CMA  Follow up: Urine sent for culture.

## 2022-08-10 ENCOUNTER — Ambulatory Visit (INDEPENDENT_AMBULATORY_CARE_PROVIDER_SITE_OTHER): Payer: Medicare Other | Admitting: Urology

## 2022-08-10 ENCOUNTER — Other Ambulatory Visit
Admission: RE | Admit: 2022-08-10 | Discharge: 2022-08-10 | Disposition: A | Discharge status: Home / Self Care | Payer: Medicare Other | Source: Ambulatory Visit | Attending: Urology | Admitting: Urology

## 2022-08-10 ENCOUNTER — Other Ambulatory Visit: Payer: Self-pay

## 2022-08-10 DIAGNOSIS — N401 Enlarged prostate with lower urinary tract symptoms: Secondary | ICD-10-CM

## 2022-08-10 DIAGNOSIS — R338 Other retention of urine: Secondary | ICD-10-CM

## 2022-08-10 DIAGNOSIS — N390 Urinary tract infection, site not specified: Secondary | ICD-10-CM | POA: Diagnosis present

## 2022-08-10 DIAGNOSIS — N138 Other obstructive and reflux uropathy: Secondary | ICD-10-CM

## 2022-08-10 LAB — URINALYSIS, COMPLETE (UACMP) WITH MICROSCOPIC
Bacteria, UA: NONE SEEN
Glucose, UA: NEGATIVE mg/dL
Nitrite: NEGATIVE
Protein, ur: 100 mg/dL — AB
Specific Gravity, Urine: 1.02 (ref 1.005–1.030)
Squamous Epithelial / HPF: NONE SEEN (ref 0–5)
WBC, UA: NONE SEEN WBC/hpf (ref 0–5)
pH: 5 (ref 5.0–8.0)

## 2022-08-13 LAB — URINE CULTURE: Culture: 30000 — AB

## 2022-08-18 ENCOUNTER — Inpatient Hospital Stay
Admission: RE | Admit: 2022-08-18 | Discharge: 2022-08-18 | Disposition: A | Discharge status: Home / Self Care | Payer: Medicare Other | Source: Ambulatory Visit

## 2022-08-18 ENCOUNTER — Telehealth: Payer: Self-pay

## 2022-08-18 MED ORDER — NITROFURANTOIN MONOHYD MACRO 100 MG PO CAPS: 100.0000 mg | ORAL_CAPSULE | Freq: Two times a day (BID) | ORAL | 0 refills | Status: DC

## 2022-08-18 NOTE — Patient Instructions (Addendum)
Your procedure is scheduled on: Monday, November 27 Report to the Registration Desk on the 1st floor of the [facility]. To find out your arrival time, please call [phone] between 1PM - 3PM on: Friday, November 24 If your arrival time is 6:00 am, do not arrive prior to that time as the [place] entrance doors do not open until 6:00 am.  REMEMBER: Instructions that are not followed completely may result in serious medical risk, up to and including death; or upon the discretion of your surgeon and anesthesiologist your surgery may need to be rescheduled.  Do not eat or drink after midnight the night before surgery.  No gum chewing, lozengers or hard candies.  TAKE THESE MEDICATIONS THE MORNING OF SURGERY WITH A SIP OF WATER:  Atorvastatin (Lipitor) Metoprolol Tamsulosin (Flomax)  One week prior to surgery: starting today November 21 Stop Anti-inflammatories (NSAIDS) such as Advil, Aleve, Ibuprofen, Motrin, Naproxen, Naprosyn and Aspirin based products such as Excedrin, Goodys Powder, BC Powder. Stop ANY OVER THE COUNTER supplements until after surgery. You may however, continue to take Tylenol if needed for pain up until the day of surgery.  No Alcohol for 24 hours before or after surgery.  No Smoking including e-cigarettes for 24 hours prior to surgery.  No chewable tobacco products for at least 6 hours prior to surgery.  No nicotine patches on the day of surgery.  Do not use any "recreational" drugs for at least a week prior to your surgery.  Please be advised that the combination of cocaine and anesthesia may have negative outcomes, up to and including death. If you test positive for cocaine, your surgery will be cancelled.  On the morning of surgery brush your teeth with toothpaste and water, you may rinse your mouth with mouthwash if you wish. Do not swallow any toothpaste or mouthwash.  Do not wear jewelry, make-up, hairpins, clips or nail polish.  Do not wear  lotions, powders, or perfumes.   Do not shave body from the neck down 48 hours prior to surgery just in case you cut yourself which could leave a site for infection.   Contact lenses, hearing aids and dentures may not be worn into surgery.  Do not bring valuables to the hospital. [facility] is not responsible for any missing/lost belongings or valuables.   Bring your C-PAP to the hospital with you in case you may have to spend the night.   Notify your doctor if there is any change in your medical condition (cold, fever, infection).  Wear comfortable clothing (specific to your surgery type) to the hospital.  After surgery, you can help prevent lung complications by doing breathing exercises.  Take deep breaths and cough every 1-2 hours. Your doctor may order a device called an Incentive Spirometer to help you take deep breaths.  If you are being discharged the day of surgery, you will not be allowed to drive home. You will need a responsible adult (18 years or older) to drive you home and stay with you that night.   If you are taking public transportation, you will need to have a responsible adult (18 years or older) with you. Please confirm with your physician that it is acceptable to use public transportation.   Please call the [place] Dept. at [phone] if you have any questions about these instructions.  Surgery Visitation Policy:  Patients undergoing a surgery or procedure may have two family members or support persons with them as long as  the person is not COVID-19 positive or experiencing its symptoms.

## 2022-08-18 NOTE — Telephone Encounter (Signed)
Spoke with pt. Pt. Advised of all results and verbalized understanding. Will start taking medication tomorrow 11/22. Sent medication in to [facility] per patient request.

## 2022-08-18 NOTE — Telephone Encounter (Signed)
-----   Message from Hollice Espy, MD sent at 08/13/2022 11:19 AM EST ----- Urine culture grew Enterococcus, please treat with Macrobid twice daily for total of 10 days starting 5 days prior to the procedure.  Hollice Espy, MD

## 2022-08-19 ENCOUNTER — Encounter: Payer: Self-pay | Admitting: Urgent Care

## 2022-08-19 ENCOUNTER — Encounter
Admission: RE | Admit: 2022-08-19 | Discharge: 2022-08-19 | Disposition: A | Discharge status: Home / Self Care | Payer: Medicare Other | Source: Ambulatory Visit | Attending: Urology | Admitting: Urology

## 2022-08-19 ENCOUNTER — Encounter: Payer: Self-pay | Admitting: Urology

## 2022-08-19 VITALS — Ht 70.0 in | Wt 170.0 lb

## 2022-08-19 DIAGNOSIS — I10 Essential (primary) hypertension: Secondary | ICD-10-CM | POA: Diagnosis not present

## 2022-08-19 DIAGNOSIS — Z01812 Encounter for preprocedural laboratory examination: Secondary | ICD-10-CM

## 2022-08-19 DIAGNOSIS — Z01818 Encounter for other preprocedural examination: Secondary | ICD-10-CM | POA: Diagnosis present

## 2022-08-19 LAB — CBC
HCT: 42.8 % (ref 39.0–52.0)
Hemoglobin: 14 g/dL (ref 13.0–17.0)
MCH: 27.9 pg (ref 26.0–34.0)
MCHC: 32.7 g/dL (ref 30.0–36.0)
MCV: 85.4 fL (ref 80.0–100.0)
Platelets: 194 10*3/uL (ref 150–400)
RBC: 5.01 MIL/uL (ref 4.22–5.81)
RDW: 12.9 % (ref 11.5–15.5)
WBC: 7 10*3/uL (ref 4.0–10.5)
nRBC: 0 % (ref 0.0–0.2)

## 2022-08-19 LAB — BASIC METABOLIC PANEL
Anion gap: 5 (ref 5–15)
BUN: 23 mg/dL (ref 8–23)
CO2: 27 mmol/L (ref 22–32)
Calcium: 9.1 mg/dL (ref 8.9–10.3)
Chloride: 107 mmol/L (ref 98–111)
Creatinine, Ser: 1.01 mg/dL (ref 0.61–1.24)
GFR, Estimated: >60 mL/min (ref >60)
Glucose, Bld: 133 mg/dL — ABNORMAL HIGH (ref 70–99)
Potassium: 3.9 mmol/L (ref 3.5–5.1)
Sodium: 139 mmol/L (ref 135–145)

## 2022-08-19 NOTE — Patient Instructions (Signed)
Your procedure is scheduled on: Monday, November 27 Report to the Registration Desk on the 1st floor of the [facility]. To find out your arrival time, please call [phone] between 1PM - 3PM on: Friday, November 24 If your arrival time is 6:00 am, do not arrive prior to that time as the [place] entrance doors do not open until 6:00 am.  REMEMBER: Instructions that are not followed completely may result in serious medical risk, up to and including death; or upon the discretion of your surgeon and anesthesiologist your surgery may need to be rescheduled.  Do not eat or drink after midnight the night before surgery.  No gum chewing, lozengers or hard candies.  TAKE THESE MEDICATIONS THE MORNING OF SURGERY WITH A SIP OF WATER:  Atorvastatin (Lipitor) Metoprolol Macrobid Tamsulosin (Flomax)  One week prior to surgery: starting today, November 22 Stop aspirin and Anti-inflammatories (NSAIDS) such as Advil, Aleve, Ibuprofen, Motrin, Naproxen, Naprosyn and Aspirin based products such as Excedrin, Goodys Powder, BC Powder. Stop ANY OVER THE COUNTER supplements until after surgery. You may however, continue to take Tylenol if needed for pain up until the day of surgery.  No Alcohol for 24 hours before or after surgery.  No Smoking including e-cigarettes for 24 hours prior to surgery.  No chewable tobacco products for at least 6 hours prior to surgery.  No nicotine patches on the day of surgery.  Do not use any "recreational" drugs for at least a week prior to your surgery.  Please be advised that the combination of cocaine and anesthesia may have negative outcomes, up to and including death. If you test positive for cocaine, your surgery will be cancelled.  On the morning of surgery brush your teeth with toothpaste and water, you may rinse your mouth with mouthwash if you wish. Do not swallow any toothpaste or mouthwash.  Do not wear jewelry.  Do not wear lotions, powders, or  perfumes.   Do not shave body from the neck down 48 hours prior to surgery just in case you cut yourself which could leave a site for infection.   Contact lenses, hearing aids and dentures may not be worn into surgery.  Do not bring valuables to the hospital. [facility] is not responsible for any missing/lost belongings or valuables.   Notify your doctor if there is any change in your medical condition (cold, fever, infection).  Wear comfortable clothing (specific to your surgery type) to the hospital.  After surgery, you can help prevent lung complications by doing breathing exercises.  Take deep breaths and cough every 1-2 hours. Your doctor may order a device called an Incentive Spirometer to help you take deep breaths.  If you are being discharged the day of surgery, you will not be allowed to drive home. You will need a responsible adult (18 years or older) to drive you home and stay with you that night.   If you are taking public transportation, you will need to have a responsible adult (18 years or older) with you. Please confirm with your physician that it is acceptable to use public transportation.   Please call the [place] Dept. at [phone] if you have any questions about these instructions.  Surgery Visitation Policy:  Patients undergoing a surgery or procedure may have two family members or support persons with them as long as the person is not COVID-19 positive or experiencing its symptoms.

## 2022-08-19 NOTE — Progress Notes (Signed)
Perioperative Services  Pre-Admission/Anesthesia Testing Clinical Review  Date: 08/19/22  Patient Demographics:  Name: Joseph Esparza DOB:   April 07, 1946 MRN:   [id number]  Planned Surgical Procedure(s):    Case: [id number] Date/Time: 08/24/22 0730   Procedure: HOLEP-LASER ENUCLEATION OF THE PROSTATE WITH MORCELLATION   Anesthesia type: General   Pre-op diagnosis: Benign Prostatic Hyperplasia with Urinary Obstruction   Location: [facility] OR ROOM 10 / [place] ORS FOR ANESTHESIA GROUP   Surgeons: Hollice Espy, MD   NOTE: Available PAT nursing documentation and vital signs have been reviewed. Clinical nursing staff has updated patient's PMH/PSHx, current medication list, and drug allergies/intolerances to ensure comprehensive history available to assist in medical decision making as it pertains to the aforementioned surgical procedure and anticipated anesthetic course. Extensive review of available clinical information performed. [facility] PMH and PSHx updated with any diagnoses/procedures that  may have been inadvertently omitted during his intake with the pre-admission testing department's nursing staff.  Clinical Discussion:  Joseph Esparza is a 76 y.o. male who is submitted for pre-surgical anesthesia review and clearance prior to him undergoing the above procedure. Patient is a Former Smoker (5 pack years; quit 09/1963). Pertinent PMH includes: CAD, STEMI, cardiomyopathy, diastolic dysfunction, angina, HTN, HLD, prediabetes, GERD (no daily Tx), inguinal hernia, BPH, hearing loss.  Patient is followed by cardiology Clayborn Bigness 09/10/2021, MD). He was last seen in the cardiology clinic on 09/10/2021; notes reviewed.  At the time of his clinic visit, patient reported to be doing "reasonably well" from a cardiovascular perspective.  He denied any episodes of worsening angina, shortness breath, PND, orthopnea, palpitations, fatigue, significant peripheral edema, vertiginous symptoms, or  presyncope/syncope.  Patient with a past medical history significant for cardiovascular diagnoses.  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  Diagnostic left heart catheterization at that time revealed a moderately reduced left ventricular systolic function with an EF of 35-45%.  There was anteroapical akinesis.  Single-vessel CAD noted; 100% occlusion of the mid LAD.  Subsequent PCI was performed placing a 3.25 x 23 mm Xience Alpine DES x1 yielding excellent angiographic result and TIMI-3 flow.  Myocardial perfusion imaging study performed on 09/02/2017 revealed a low normal left ventricular systolic function with an EF of 50%.  There was anterior apical hypokinesis observed.  SPECT images demonstrated a moderate perfusion abnormality present in the anterior apical region suggestive of reversible ischemia.  Study determined to be abnormal.  TTE performed on 09/02/2017 revealed a mildly reduced left ventricular systolic function with an EF of 45%. Diastolic Doppler parameters consistent with abnormal relaxation (G1DD).  There was mild biatrial enlargement, in addition to mild right ventricular enlargement.  There was trivial to mild pan valvular regurgitation.  There was no evidence of a significant transvalvular gradient to suggest stenosis.  Blood pressure reasonably controlled in the 130/70 range on currently prescribed ARB (losartan) and beta-blocker (metoprolol succinate) therapies.  Patient is on atorvastatin for his HLD diagnosis and further ASCVD prevention.  He has a prediabetes diagnosis; last HgbA1c was 5.9% when checked on 02/19/2022.  Patient making efforts to be more active. Functional capacity, as defined by DASI, is documented as being >/= 4 METS.  No changes were made to his medication regimen.  Patient to follow-up with outpatient cardiology in 1 year or sooner if needed.  PRESTIN MUNCH is scheduled for an elective HOLEP-LASER ENUCLEATION OF THE PROSTATE WITH MORCELLATION on  08/24/2022 with Dr. Hollice Espy, MD. Given patient's past medical history significant for cardiovascular diagnoses, presurgical  cardiac clearance was sought by the performing surgeon's office and PAT team. Per cardiology, "this patient is optimized for surgery and may proceed with the planned procedural course with a MODERATE risk of significant perioperative cardiovascular complications".  In review of his medication reconciliation, it is noted the patient is on daily antiplatelet therapy. He has been instructed on recommendations from his cardiologist for holding his daily low-dose ASA for 5 days prior to his procedure with plans to restart as soon as postoperative bleeding risk felt to be minimized by his primary attending surgeon. The patient is aware that his last dose of ASA should be on 08/18/2022.  Patient denies previous perioperative complications with anesthesia in the past. In review of the available records, it is noted that patient underwent a general anesthetic course here at [facility] (ASA III) in 02/2022 without documented complications.      08/19/2022   11:07 AM 07/28/2022    2:44 PM 07/16/2022    8:16 AM  Vitals with BMI  Height '5\' 10"'$  '5\' 10"'$  '5\' 10"'$   Weight 170 lbs 170 lbs 170 lbs  BMI 24.39 62.69 48.54  Systolic  627 035  Diastolic  81 74  Pulse  63 85    Providers/Specialists:   NOTE: Primary physician provider listed below. Patient may have been seen by APP or partner within same practice.   PROVIDER ROLE / SPECIALTY LAST Lu Duffel, MD Urology (Surgeon) 08/10/2022  Barbaraann Boys, MD Primary Care Provider 02/19/2022  Katrine Coho, MD Cardiology 09/10/2021   Allergies:  Patient has no known allergies.  Current Home Medications:    aspirin 81 MG chewable tablet   atorvastatin (LIPITOR) 80 MG tablet   latanoprost (XALATAN) 0.005 % ophthalmic solution   losartan (COZAAR) 25 MG tablet   metoprolol succinate  (TOPROL-XL) 25 MG 24 hr tablet   nitrofurantoin, macrocrystal-monohydrate, (MACROBID) 100 MG capsule   tamsulosin (FLOMAX) 0.4 MG CAPS capsule   No current facility-administered medications for this encounter.   History:   Past Medical History:  Diagnosis Date   Anginal pain ([place])    BPH (benign prostatic hyperplasia)    Cardiomyopathy ([place]) 05/01/2017   a.) STEMI 05/01/2017 --> STEMI --> EF 35-45%. b.) TTE 05/02/2017: EF 40-45%. c.) TTE 09/02/2017: EF 45%.   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 ([place]) 05/01/2017   a.) LHC 05/01/2017: EF 35-45%; anteroapical AK; 100% mLAD --> PCI placing a 3.25 x 23 mm Xience Alpine DES x 1   Past Surgical History:  Procedure Laterality Date   COLONOSCOPY WITH PROPOFOL N/A 02/03/2021   Procedure: COLONOSCOPY WITH PROPOFOL;  Surgeon: Lesly Rubenstein, MD;  Location: [facility] ENDOSCOPY;  Service: Endoscopy;  Laterality: N/A;   CORONARY BALLOON ANGIOPLASTY N/A 05/01/2017   Procedure: CORONARY BALLOON ANGIOPLASTY;  Surgeon: Yolonda Kida, MD;  Location: [place] CV LAB;  Service: Cardiovascular;  Laterality: N/A;   CORONARY/GRAFT ACUTE MI REVASCULARIZATION N/A 05/01/2017   Procedure: Coronary/Graft Acute MI Revascularization;  Surgeon: Yolonda Kida, MD;  Location: [place] CV LAB;  Service: Cardiovascular;  Laterality: N/A;   INSERTION OF MESH  03/11/2022   Procedure: INSERTION OF MESH;   Surgeon: Ronny Bacon, MD;  Location: [facility] ORS;  Service: General;;   LEFT HEART CATH AND CORONARY ANGIOGRAPHY N/A 05/01/2017   Procedure: LEFT HEART CATH AND CORONARY ANGIOGRAPHY;  Surgeon: Yolonda Kida, MD;  Location: [place] CV LAB;  Service: Cardiovascular;  Laterality: N/A;   VASECTOMY N/A    Family History  Problem Relation Age of Onset   Heart disease Mother    Heart disease Father    Social History   Tobacco Use   Smoking status: Former    Packs/day: 0.50    Years: 10.00    Total pack years: 5.00    Types: Cigarettes    Quit date: 09/29/1963    Years since quitting: 58.9   Smokeless tobacco: Never   Tobacco comments:    Quit in his 20's  smoked maybe 10 years  Vaping Use   Vaping Use: Never used  Substance Use Topics   Alcohol use: Yes    Alcohol/week: 3.0 standard drinks of alcohol    Types: 3 Cans of beer per week    Comment: occ   Drug use: No    Pertinent Clinical Results:  LABS: Labs reviewed: Acceptable for surgery.   Hospital Outpatient Visit on 08/19/2022  Component Date Value Ref Range Status   Sodium 08/19/2022 139  135 - 145 mmol/L Final   Potassium 08/19/2022 3.9  3.5 - 5.1 mmol/L Final   Chloride 08/19/2022 107  98 - 111 mmol/L Final   CO2 08/19/2022 27  22 - 32 mmol/L Final   Glucose, Bld 08/19/2022 133 (H)  70 - 99 mg/dL Final   Glucose reference range applies only to samples taken after fasting for at least 8 hours.   BUN 08/19/2022 23  8 - 23 mg/dL Final   Creatinine, Ser 08/19/2022 1.01  0.61 - 1.24 mg/dL Final   Calcium 08/19/2022 9.1  8.9 - 10.3 mg/dL Final   GFR, Estimated 08/19/2022 >60  >60 mL/min Final   Comment: (NOTE) Calculated using the CKD-EPI Creatinine Equation (2021)    Anion gap 08/19/2022 5  5 - 15 Final   Performed at [facility], [address] [place], [place] [address]   WBC 08/19/2022 7.0  4.0 - 10.5 K/uL Final   RBC 08/19/2022 5.01  4.22 - 5.81 MIL/uL Final   Hemoglobin 08/19/2022 14.0   13.0 - 17.0 g/dL Final   HCT 08/19/2022 42.8  39.0 - 52.0 % Final   MCV 08/19/2022 85.4  80.0 - 100.0 fL Final   MCH 08/19/2022 27.9  26.0 - 34.0 pg Final   MCHC 08/19/2022 32.7  30.0 - 36.0 g/dL Final   RDW 08/19/2022 12.9  11.5 - 15.5 % Final   Platelets 08/19/2022 194  150 - 400 K/uL Final   nRBC 08/19/2022 0.0  0.0 - 0.2 % Final   Performed at [facility], [address] [place],  [address]    ECG: Date: 02/27/2022 Time ECG obtained: 1032 AM Rate: 62 bpm Rhythm: normal sinus Axis (leads I and aVF): Normal Intervals: PR 184 ms. QRS 88 ms. QTc 382 ms. ST segment and T wave changes: Nonspecific anterior T wave  abnormality; evidence of age undetermined anterior infarction  Comparison: Similar to previous tracing obtained on 06/27/2018   IMAGING / PROCEDURES: TRANSTHORACIC ECHOCARDIOGRAM performed on 91/50/5697 LVEF 94% Diastolic Doppler parameters consistent with abnormal relaxation (G1DD). Mild biatrial enlargement Mild right ventricular enlargement Trivial AR and PR Mild MR and TR No pericardial effusion  MYOCARDIAL PERFUSION IMAGING STUDY (LEXISCAN) performed on 09/02/2017 Low normal left ventricular systolic function with an EF of 50% Anterior apical wall hypokinesis SPECT images demonstrate moderate perfusion abnormality of moderate intensity in the anterior apical region suggestive of reversible ischemia  LEFT HEART CATHETERIZATION AND CORONARY ANGIOGRAPHY performed on 05/01/2017 Moderately reduced left ventricular systolic function with an EF of 35-45% Mildly elevated LVEDP Single-vessel CAD 100% stenosis of the mid LAD PCI 3.25 x 23 mm Xience Alpine DES x1 to the mid LAD yielding excellent angiographic result and TIMI-3 flow     Impression and Plan:  HAU SANOR has been referred for pre-anesthesia review and clearance prior to him undergoing the planned anesthetic and procedural courses. Available labs, pertinent testing, and imaging  results were personally reviewed by me. This patient has been appropriately cleared by cardiology with an overall LOW risk of significant perioperative cardiovascular complications.  Based on clinical review performed today (08/19/22), barring any significant acute changes in the patient's overall condition, it is anticipated that he will be able to proceed with the planned surgical intervention. Any acute changes in clinical condition may necessitate his procedure being postponed and/or cancelled. Patient will meet with anesthesia team (MD and/or CRNA) on the day of his procedure for preoperative evaluation/assessment. Questions regarding anesthetic course will be fielded at that time.   Pre-surgical instructions were reviewed with the patient during his PAT appointment and questions were fielded by PAT clinical staff. Patient was advised that if any questions or concerns arise prior to his procedure then he should return a call to PAT and/or his surgeon's office to discuss.  Honor Loh, MSN, APRN, FNP-C, CEN [facility]  Peri-operative Services Nurse Practitioner Phone: [phone] Fax: [phone] 08/19/22 3:00 PM  NOTE: This note has been prepared using Dragon dictation software. Despite my best ability to proofread, there is always the potential that unintentional transcriptional errors may still occur from this process.

## 2022-08-23 MED ORDER — FAMOTIDINE 20 MG PO TABS: 20.0000 mg | ORAL_TABLET | Freq: Once | ORAL | Status: AC

## 2022-08-23 MED ORDER — LACTATED RINGERS IV SOLN: INTRAVENOUS | Status: DC

## 2022-08-23 MED ORDER — ORAL CARE MOUTH RINSE: 15.0000 mL | Freq: Once | OROMUCOSAL | Status: AC

## 2022-08-23 MED ORDER — CHLORHEXIDINE GLUCONATE 0.12 % MT SOLN: 15.0000 mL | Freq: Once | OROMUCOSAL | Status: AC

## 2022-08-23 MED ORDER — CEFAZOLIN SODIUM-DEXTROSE 2-4 GM/100ML-% IV SOLN
2.0000 g | INTRAVENOUS | Status: AC
  Administered 2022-08-24: 2 g via INTRAVENOUS

## 2022-08-24 ENCOUNTER — Ambulatory Visit: Payer: Medicare Other | Admitting: Anesthesiology

## 2022-08-24 ENCOUNTER — Ambulatory Visit: Payer: Medicare Other | Admitting: Urgent Care

## 2022-08-24 ENCOUNTER — Ambulatory Visit
Admission: RE | Admit: 2022-08-24 | Discharge: 2022-08-24 | Disposition: A | Discharge status: Home / Self Care | Payer: Medicare Other | Attending: Urology | Admitting: Urology

## 2022-08-24 ENCOUNTER — Encounter
Admission: RE | Disposition: A | Discharge status: Home / Self Care | Payer: Self-pay | Source: Home / Self Care | Attending: Urology

## 2022-08-24 ENCOUNTER — Encounter: Payer: Self-pay | Admitting: Urology

## 2022-08-24 ENCOUNTER — Other Ambulatory Visit: Payer: Self-pay

## 2022-08-24 DIAGNOSIS — K219 Gastro-esophageal reflux disease without esophagitis: Secondary | ICD-10-CM | POA: Insufficient documentation

## 2022-08-24 DIAGNOSIS — E785 Hyperlipidemia, unspecified: Secondary | ICD-10-CM | POA: Diagnosis not present

## 2022-08-24 DIAGNOSIS — Z87891 Personal history of nicotine dependence: Secondary | ICD-10-CM | POA: Diagnosis not present

## 2022-08-24 DIAGNOSIS — N401 Enlarged prostate with lower urinary tract symptoms: Secondary | ICD-10-CM

## 2022-08-24 DIAGNOSIS — N138 Other obstructive and reflux uropathy: Secondary | ICD-10-CM

## 2022-08-24 DIAGNOSIS — I429 Cardiomyopathy, unspecified: Secondary | ICD-10-CM | POA: Insufficient documentation

## 2022-08-24 DIAGNOSIS — R7303 Prediabetes: Secondary | ICD-10-CM | POA: Insufficient documentation

## 2022-08-24 DIAGNOSIS — I10 Essential (primary) hypertension: Secondary | ICD-10-CM | POA: Diagnosis not present

## 2022-08-24 DIAGNOSIS — Z86010 Personal history of colonic polyps: Secondary | ICD-10-CM | POA: Diagnosis not present

## 2022-08-24 DIAGNOSIS — R338 Other retention of urine: Secondary | ICD-10-CM | POA: Insufficient documentation

## 2022-08-24 DIAGNOSIS — H902 Conductive hearing loss, unspecified: Secondary | ICD-10-CM | POA: Insufficient documentation

## 2022-08-24 DIAGNOSIS — Z87442 Personal history of urinary calculi: Secondary | ICD-10-CM | POA: Diagnosis not present

## 2022-08-24 DIAGNOSIS — N320 Bladder-neck obstruction: Secondary | ICD-10-CM | POA: Diagnosis not present

## 2022-08-24 DIAGNOSIS — I2510 Atherosclerotic heart disease of native coronary artery without angina pectoris: Secondary | ICD-10-CM | POA: Insufficient documentation

## 2022-08-24 DIAGNOSIS — I252 Old myocardial infarction: Secondary | ICD-10-CM | POA: Insufficient documentation

## 2022-08-24 DIAGNOSIS — N411 Chronic prostatitis: Secondary | ICD-10-CM | POA: Insufficient documentation

## 2022-08-24 HISTORY — PX: HOLEP-LASER ENUCLEATION OF THE PROSTATE WITH MORCELLATION: SHX6641

## 2022-08-24 SURGERY — ENUCLEATION, PROSTATE, USING LASER, WITH MORCELLATION
Anesthesia: General

## 2022-08-24 MED ORDER — EPHEDRINE 5 MG/ML INJ
INTRAVENOUS | Status: AC
  Filled 2022-08-24: qty 5

## 2022-08-24 MED ORDER — PHENYLEPHRINE HCL-NACL 20-0.9 MG/250ML-% IV SOLN
INTRAVENOUS | Status: DC | PRN
  Administered 2022-08-24: 10 ug/min via INTRAVENOUS

## 2022-08-24 MED ORDER — DEXAMETHASONE SODIUM PHOSPHATE 10 MG/ML IJ SOLN
INTRAMUSCULAR | Status: DC | PRN
  Administered 2022-08-24: 4 mg via INTRAVENOUS

## 2022-08-24 MED ORDER — ONDANSETRON HCL 4 MG/2ML IJ SOLN
INTRAMUSCULAR | Status: AC
  Filled 2022-08-24: qty 2

## 2022-08-24 MED ORDER — FENTANYL CITRATE (PF) 100 MCG/2ML IJ SOLN
INTRAMUSCULAR | Status: DC | PRN
  Administered 2022-08-24 (×2): 25 ug via INTRAVENOUS
  Administered 2022-08-24: 50 ug via INTRAVENOUS

## 2022-08-24 MED ORDER — EPHEDRINE SULFATE (PRESSORS) 50 MG/ML IJ SOLN
INTRAMUSCULAR | Status: DC | PRN
  Administered 2022-08-24 (×4): 5 mg via INTRAVENOUS

## 2022-08-24 MED ORDER — FENTANYL CITRATE (PF) 100 MCG/2ML IJ SOLN: 25.0000 ug | INTRAMUSCULAR | Status: DC | PRN

## 2022-08-24 MED ORDER — HYDROCODONE-ACETAMINOPHEN 5-325 MG PO TABS: 1.0000 | ORAL_TABLET | Freq: Four times a day (QID) | ORAL | 0 refills | Status: DC | PRN

## 2022-08-24 MED ORDER — PROPOFOL 10 MG/ML IV BOLUS
INTRAVENOUS | Status: DC | PRN
  Administered 2022-08-24: 20 mg via INTRAVENOUS
  Administered 2022-08-24: 100 mg via INTRAVENOUS
  Administered 2022-08-24: 60 mg via INTRAVENOUS

## 2022-08-24 MED ORDER — PROMETHAZINE HCL 25 MG/ML IJ SOLN: 6.2500 mg | INTRAMUSCULAR | Status: DC | PRN

## 2022-08-24 MED ORDER — CEFAZOLIN SODIUM-DEXTROSE 2-4 GM/100ML-% IV SOLN
INTRAVENOUS | Status: AC
  Filled 2022-08-24: qty 100

## 2022-08-24 MED ORDER — FUROSEMIDE 10 MG/ML IJ SOLN
INTRAMUSCULAR | Status: DC | PRN
  Administered 2022-08-24: 10 mg via INTRAMUSCULAR

## 2022-08-24 MED ORDER — FUROSEMIDE 10 MG/ML IJ SOLN
INTRAMUSCULAR | Status: AC
  Filled 2022-08-24: qty 4

## 2022-08-24 MED ORDER — DROPERIDOL 2.5 MG/ML IJ SOLN: 0.6250 mg | Freq: Once | INTRAMUSCULAR | Status: DC | PRN

## 2022-08-24 MED ORDER — ROCURONIUM BROMIDE 100 MG/10ML IV SOLN
INTRAVENOUS | Status: DC | PRN
  Administered 2022-08-24: 5 mg via INTRAVENOUS
  Administered 2022-08-24: 10 mg via INTRAVENOUS
  Administered 2022-08-24: 60 mg via INTRAVENOUS

## 2022-08-24 MED ORDER — SUGAMMADEX SODIUM 200 MG/2ML IV SOLN
INTRAVENOUS | Status: DC | PRN
  Administered 2022-08-24: 200 mg via INTRAVENOUS

## 2022-08-24 MED ORDER — PHENYLEPHRINE 80 MCG/ML (10ML) SYRINGE FOR IV PUSH (FOR BLOOD PRESSURE SUPPORT)
PREFILLED_SYRINGE | INTRAVENOUS | Status: AC
  Filled 2022-08-24: qty 10

## 2022-08-24 MED ORDER — FENTANYL CITRATE (PF) 100 MCG/2ML IJ SOLN
INTRAMUSCULAR | Status: AC
  Filled 2022-08-24: qty 2

## 2022-08-24 MED ORDER — LIDOCAINE HCL (CARDIAC) PF 100 MG/5ML IV SOSY
PREFILLED_SYRINGE | INTRAVENOUS | Status: DC | PRN
  Administered 2022-08-24: 80 mg via INTRAVENOUS

## 2022-08-24 MED ORDER — DEXAMETHASONE SODIUM PHOSPHATE 10 MG/ML IJ SOLN
INTRAMUSCULAR | Status: AC
  Filled 2022-08-24: qty 1

## 2022-08-24 MED ORDER — SUCCINYLCHOLINE CHLORIDE 200 MG/10ML IV SOSY
PREFILLED_SYRINGE | INTRAVENOUS | Status: AC
  Filled 2022-08-24: qty 10

## 2022-08-24 MED ORDER — ONDANSETRON HCL 4 MG/2ML IJ SOLN
INTRAMUSCULAR | Status: DC | PRN
  Administered 2022-08-24: 4 mg via INTRAVENOUS

## 2022-08-24 MED ORDER — ACETAMINOPHEN 10 MG/ML IV SOLN
INTRAVENOUS | Status: AC
  Filled 2022-08-24: qty 100

## 2022-08-24 MED ORDER — CHLORHEXIDINE GLUCONATE 0.12 % MT SOLN
OROMUCOSAL | Status: AC
  Administered 2022-08-24: 15 mL via OROMUCOSAL
  Filled 2022-08-24: qty 15

## 2022-08-24 MED ORDER — GLYCOPYRROLATE 0.2 MG/ML IJ SOLN
INTRAMUSCULAR | Status: AC
  Filled 2022-08-24: qty 1

## 2022-08-24 MED ORDER — ACETAMINOPHEN 10 MG/ML IV SOLN
INTRAVENOUS | Status: DC | PRN
  Administered 2022-08-24: 1000 mg via INTRAVENOUS

## 2022-08-24 MED ORDER — ROCURONIUM BROMIDE 10 MG/ML (PF) SYRINGE
PREFILLED_SYRINGE | INTRAVENOUS | Status: AC
  Filled 2022-08-24: qty 10

## 2022-08-24 MED ORDER — LIDOCAINE HCL (PF) 2 % IJ SOLN
INTRAMUSCULAR | Status: AC
  Filled 2022-08-24: qty 5

## 2022-08-24 MED ORDER — PROPOFOL 10 MG/ML IV BOLUS
INTRAVENOUS | Status: AC
  Filled 2022-08-24: qty 40

## 2022-08-24 MED ORDER — STERILE WATER FOR IRRIGATION IR SOLN
Status: DC | PRN
  Administered 2022-08-24: 600 mL

## 2022-08-24 MED ORDER — OXYCODONE HCL 5 MG/5ML PO SOLN: 5.0000 mg | Freq: Once | ORAL | Status: DC | PRN

## 2022-08-24 MED ORDER — SODIUM CHLORIDE 0.9 % IR SOLN
Status: DC | PRN
  Administered 2022-08-24: 18000 mL
  Administered 2022-08-24 (×3): 6000 mL

## 2022-08-24 MED ORDER — OXYCODONE HCL 5 MG PO TABS: 5.0000 mg | ORAL_TABLET | Freq: Once | ORAL | Status: DC | PRN

## 2022-08-24 MED ORDER — SEVOFLURANE IN SOLN
RESPIRATORY_TRACT | Status: AC
  Filled 2022-08-24: qty 250

## 2022-08-24 MED ORDER — GLYCOPYRROLATE 0.2 MG/ML IJ SOLN
INTRAMUSCULAR | Status: DC | PRN
  Administered 2022-08-24: .2 mg via INTRAVENOUS

## 2022-08-24 MED ORDER — OXYBUTYNIN CHLORIDE 5 MG PO TABS: 5.0000 mg | ORAL_TABLET | Freq: Three times a day (TID) | ORAL | 0 refills | Status: AC | PRN

## 2022-08-24 MED ORDER — FAMOTIDINE 20 MG PO TABS
ORAL_TABLET | ORAL | Status: AC
  Administered 2022-08-24: 20 mg via ORAL
  Filled 2022-08-24: qty 1

## 2022-08-24 MED ORDER — ACETAMINOPHEN 10 MG/ML IV SOLN: 1000.0000 mg | Freq: Once | INTRAVENOUS | Status: DC | PRN

## 2022-08-24 SURGICAL SUPPLY — 38 items
ADAPTER IRRIG TUBE 2 SPIKE SOL (ADAPTER) ×2 IMPLANT
ADPR TBG 2 SPK PMP STRL ASCP (ADAPTER) ×2
BAG DRN LRG CPC RND TRDRP CNTR (MISCELLANEOUS)
BAG DRN RND TRDRP ANRFLXCHMBR (UROLOGICAL SUPPLIES)
BAG URINE DRAIN 2000ML AR STRL (UROLOGICAL SUPPLIES) IMPLANT
BAG URO DRAIN 4000ML (MISCELLANEOUS) IMPLANT
CATH FOL 2WAY LX 20X30 (CATHETERS) IMPLANT
CATH FOL 2WAY LX 22X30 (CATHETERS) IMPLANT
CATH FOLEY 3WAY 30CC 22FR (CATHETERS) IMPLANT
CATH URETL OPEN END 4X70 (CATHETERS) ×1 IMPLANT
CONTAINER COLLECT MORCELLATR (MISCELLANEOUS) ×1 IMPLANT
DRAPE 3/4 80X56 (DRAPES) ×1 IMPLANT
DRAPE UTILITY 15X26 TOWEL STRL (DRAPES) IMPLANT
FIBER LASER MOSES 550 DFL (Laser) ×1 IMPLANT
FILTER OVERFLOW MORCELLATOR (FILTER) ×1 IMPLANT
GAUZE 4X4 16PLY ~~LOC~~+RFID DBL (SPONGE) ×2 IMPLANT
GLOVE BIO SURGEON STRL SZ 6.5 (GLOVE) ×2 IMPLANT
GOWN STRL REUS W/ TWL LRG LVL3 (GOWN DISPOSABLE) ×2 IMPLANT
GOWN STRL REUS W/TWL LRG LVL3 (GOWN DISPOSABLE) ×2
HOLDER FOLEY CATH W/STRAP (MISCELLANEOUS) ×1 IMPLANT
IV NS IRRIG 3000ML ARTHROMATIC (IV SOLUTION) ×4 IMPLANT
KIT TURNOVER CYSTO (KITS) ×1 IMPLANT
MBRN O SEALING YLW 17 FOR INST (MISCELLANEOUS) ×1
MEMBRANE SLNG YLW 17 FOR INST (MISCELLANEOUS) ×1 IMPLANT
MORCELLATOR COLLECT CONTAINER (MISCELLANEOUS) ×1
MORCELLATOR OVERFLOW FILTER (FILTER) ×1
MORCELLATOR ROTATION 4.75 335 (MISCELLANEOUS) ×1 IMPLANT
PACK CYSTO AR (MISCELLANEOUS) ×1 IMPLANT
SET CYSTO W/LG BORE CLAMP LF (SET/KITS/TRAYS/PACK) IMPLANT
SET IRRIG Y TYPE TUR BLADDER L (SET/KITS/TRAYS/PACK) ×1 IMPLANT
SLEEVE PROTECTION STRL DISP (MISCELLANEOUS) ×2 IMPLANT
SURGILUBE 2OZ TUBE FLIPTOP (MISCELLANEOUS) ×1 IMPLANT
SYR TOOMEY IRRIG 70ML (MISCELLANEOUS) ×1
SYRINGE TOOMEY IRRIG 70ML (MISCELLANEOUS) ×1 IMPLANT
TRAP FLUID SMOKE EVACUATOR (MISCELLANEOUS) ×1 IMPLANT
TUBE PUMP MORCELLATOR PIRANHA (TUBING) ×1 IMPLANT
WATER STERILE IRR 1000ML POUR (IV SOLUTION) ×1 IMPLANT
WATER STERILE IRR 500ML POUR (IV SOLUTION) ×1 IMPLANT

## 2022-08-24 NOTE — Discharge Instructions (Addendum)
Holmium Laser Enucleation of the Prostate (HoLEP)  HoLEP is a treatment for men with benign prostatic hyperplasia (BPH). The laser surgery removed blockages of urine flow, and is done without any incisions on the body.     What is HoLEP?  HoLEP is a type of laser surgery used to treat obstruction (blockage) of urine flow as a result of benign prostatic hyperplasia (BPH). In men with BPH, the prostate gland is not cancerous, but has become enlarged. An enlarged prostate can result in a number of urinary tract symptoms such as weak urinary stream, difficulty in starting urination, inability to urinate, frequent urination, or getting up at night to urinate.  HoLEP was developed in the 1990's as a more effective and less expensive surgical option for BPH, compared to other surgical options such as laser vaporization(PVP/greenlight laser), transurethral resection of the prostate(TURP), and open simple prostatectomy.   What happens during a HoLEP?  HoLEP requires general anesthesia ("asleep" throughout the procedure).   An antibiotic is given to reduce the risk of infection  A surgical instrument called a resectoscope is inserted through the urethra (the tube that carries urine from the bladder). The resectoscope has a camera that allows the surgeon to view the internal structure of the prostate gland, and to see where the incisions are being made during surgery.  The laser is inserted into the resectoscope and is used to enucleate (free up) the enlarged prostate tissue from the capsule (outer shell) and then to seal up any blood vessels. The tissue that has been removed is pushed back into the bladder.  A morcellator is placed through the resectoscope, and is used to suction out the prostate tissue that has been pushed into the bladder.  When the prostate tissue has been removed, the resectoscope is removed, and a foley catheter is placed to allow healing and drain the urine from the  bladder.     What happens after a HoLEP?  More than 90% of patients go home the same day a few hours after surgery. Less than 10% will be admitted to the hospital overnight for observation to monitor the urine, or if they have other medical problems.  Fluid is flushed through the catheter for about 1 hour after surgery to clear any blood from the urine. It is normal to have some blood in the urine after surgery. The need for blood transfusion is extremely rare.  Eating and drinking are permitted after the procedure once the patient has fully awakened from anesthesia.  The catheter is usually removed 2-3 days after surgery- the patient will come to clinic to have the catheter removed and make sure they can urinate on their own.  It is very important to drink lots of fluids after surgery for one week to keep the bladder flushed.  At first, there may be some burning with urination, but this typically improved within a few hours to days. Most patients do not have a significant amount of pain, and narcotic pain medications are rarely needed.  Symptoms of urinary frequency, urgency, and even leakage are NORMAL for the first few weeks after surgery as the bladder adjusts after having to work hard against blockage from the prostate for many years. This will improve, but can sometimes take several months.  The use of pelvic floor exercises (Kegel exercises) can help improve problems with urinary incontinence.   After catheter removal, patients will be seen at 6 weeks and 6 months for symptom check  No heavy lifting for   at least 2-3 weeks after surgery, however patients can walk and do light activities the first day after surgery. Return to work time depends on occupation.    What are the advantages of HoLEP?  HoLEP has been studied in many different parts of the world and has been shown to be a safe and effective procedure. Although there are many types of BPH surgeries available, HoLEP offers a  unique advantage in being able to remove a large amount of tissue without any incisions on the body, even in very large prostates, while decreasing the risk of bleeding and providing tissue for pathology (to look for cancer). This decreases the need for blood transfusions during surgery, minimizes hospital stay, and reduces the risk of needing repeat treatment.  What are the side effects of HoLEP?  Temporary burning and bleeding during urination. Some blood may be seen in the urine for weeks after surgery and is part of the healing process.  Urinary incontinence (inability to control urine flow) is expected in all patients immediately after surgery and they should wear pads for the first few days/weeks. This typically improves over the course of several weeks. Performing Kegel exercises can help decrease leakage from stress maneuvers such as coughing, sneezing, or lifting. The rate of long term leakage is very low. Patients may also have leakage with urgency and this may be treated with medication. The risk of urge incontinence can be dependent on several factors including age, prostate size, symptoms, and other medical problems.  Retrograde ejaculation or "backwards ejaculation." In 75% of cases, the patient will not see any fluid during ejaculation after surgery.  Erectile function is generally not significantly affected.   What are the risks of HoLEP?  Injury to the urethra or development of scar tissue at a later date  Injury to the capsule of the prostate (typically treated with longer catheterization).  Injury to the bladder or ureteral orifices (where the urine from the kidney drains out)  Infection of the bladder, testes, or kidneys  Return of urinary obstruction at a later date requiring another operation (<2%)  Need for blood transfusion or re-operation due to bleeding  Failure to relieve all symptoms and/or need for prolonged catheterization after surgery  5-15% of patients are  found to have previously undiagnosed prostate cancer in their specimen. Prostate cancer can be treated after HoLEP.  Standard risks of anesthesia including blood clots, heart attacks, etc  When should I call my doctor?  Fever over 101.3 degrees  Inability to urinate, or large blood clots in the urine  AMBULATORY SURGERY  DISCHARGE INSTRUCTIONS   The drugs that you were given will stay in your system until tomorrow so for the next 24 hours you should not:  Drive an automobile Make any legal decisions Drink any alcoholic beverage   You may resume regular meals tomorrow.  Today it is better to start with liquids and gradually work up to solid foods.  You may eat anything you prefer, but it is better to start with liquids, then soup and crackers, and gradually work up to solid foods.   Please notify your doctor immediately if you have any unusual bleeding, trouble breathing, redness and pain at the surgery site, drainage, fever, or pain not relieved by medication.    Additional Instructions:        Please contact your physician with any problems or Same Day Surgery at [phone], Monday through Friday 6 am to 4 pm, or [facility] number   at [phone].  

## 2022-08-24 NOTE — Anesthesia Preprocedure Evaluation (Addendum)
Anesthesia Evaluation  Patient identified by MRN, date of birth, ID band Patient awake    Reviewed: Allergy & Precautions, NPO status , Patient's Chart, lab work & pertinent test results  History of Anesthesia Complications Negative for: history of anesthetic complications  Airway Mallampati: III  TM Distance: >3 FB Neck ROM: Full    Dental no notable dental hx. (+) Dental Advidsory Given   Pulmonary former smoker   Pulmonary exam normal        Cardiovascular Exercise Tolerance: Good hypertension, Pt. on medications and Pt. on home beta blockers (-) angina + CAD, + Past MI and + Cardiac Stents  Normal cardiovascular exam(-) dysrhythmias (-) Valvular Problems/Murmurs     Neuro/Psych negative neurological ROS  negative psych ROS   GI/Hepatic Neg liver ROS,GERD  ,,  Endo/Other  negative endocrine ROS    Renal/GU negative Renal ROS  negative genitourinary   Musculoskeletal negative musculoskeletal ROS (+)    Abdominal Normal abdominal exam  (+)   Peds negative pediatric ROS (+)  Hematology negative hematology ROS (+)   Anesthesia Other Findings Past Medical History: No date: Anginal pain (HCC) No date: BPH (benign prostatic hyperplasia) 05/01/2017: Cardiomyopathy ([place])     Comment:  a.) STEMI 05/01/2017 --> STEMI --> EF 35-45%. b.) TTE               05/02/2017: EF 40-45%. c.) TTE 09/02/2017: EF 45%. 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)     Comment:  a.) LHC 05/01/2017: EF 35-45%; anteroapical AK; 100%               mLAD --> PCI placing a 3.25 x 23 mm Xience Alpine DES x 1   Reproductive/Obstetrics negative OB ROS                             Anesthesia Physical Anesthesia Plan  ASA: 3  Anesthesia Plan: General   Post-op Pain Management: Ofirmev IV (intra-op)* and Toradol IV (intra-op)*   Induction: Intravenous  PONV Risk Score and Plan: 2 and Ondansetron, Dexamethasone and Treatment may vary due to age or medical condition  Airway Management Planned: Oral ETT  Additional Equipment:   Intra-op Plan:   Post-operative Plan: Extubation in OR  Informed Consent: I have reviewed the patients History and Physical, chart, labs and discussed the procedure including the risks, benefits and alternatives for the proposed anesthesia with the patient or authorized representative who has indicated his/her understanding and acceptance.       Plan Discussed with: CRNA, Anesthesiologist and Surgeon  Anesthesia Plan Comments:  Anesthesia Quick Evaluation  

## 2022-08-24 NOTE — Transfer of Care (Signed)
Immediate Anesthesia Transfer of Care Note  Patient: Joseph Esparza  Procedure(s) Performed: HOLEP-LASER ENUCLEATION OF THE PROSTATE WITH MORCELLATION  Patient Location: PACU  Anesthesia Type:General  Level of Consciousness: awake, alert , oriented, and patient cooperative  Airway & Oxygen Therapy: Patient Spontanous Breathing and Patient connected to face mask oxygen  Post-op Assessment: Report given to RN and Post -op Vital signs reviewed and stable  Post vital signs: Reviewed and stable  Last Vitals:  Vitals Value Taken Time  BP 142/74 08/24/22 1003  Temp 36.4 C 08/24/22 1003  Pulse 68 08/24/22 1012  Resp 19 08/24/22 1012  SpO2 100 % 08/24/22 1012  Vitals shown include unvalidated device data.  Last Pain:  Vitals:   08/24/22 1003  TempSrc:   PainSc: 0-No pain         Complications: No notable events documented.

## 2022-08-24 NOTE — Op Note (Signed)
Date of procedure: 08/24/22  Preoperative diagnosis:  BPH with BOO Urinary retention  Postoperative diagnosis:  same   Procedure: HoLEP with morcellation  Surgeon: Hollice Espy, MD  Anesthesia: General  Complications: None  Intraoperative findings: Trilobar coaptation with trabeculated bladder  EBL: 100 cc  Specimens: Prostate chips  Drains: 20 French to a Foley catheter with 50 cc in the balloon  Indication: Joseph Esparza is a 76 y.o. patient with BPH with urinary retention status post multiple failed voiding trials.  After reviewing the management options for treatment, he elected to proceed with the above surgical procedure(s). We have discussed the potential benefits and risks of the procedure, side effects of the proposed treatment, the likelihood of the patient achieving the goals of the procedure, and any potential problems that might occur during the procedure or recuperation. Informed consent has been obtained.  Description of procedure:  The patient was taken to the operating room and general anesthesia was induced.  The patient was placed in the dorsal lithotomy position, prepped and draped in the usual sterile fashion, and preoperative antibiotics were administered. A preoperative time-out was performed.     A 26 French resectoscope sheath using a blunt angled obturator was introduced without difficulty into the bladder.  The bladder was carefully inspected and noted to be moderately trabeculated.  There is an elevated bladder neck with a very small intravesical component.  The trigone was able to be visualized with some manipulation and the UOs were good distance from the bladder neck itself.  The prostatic fossa had significant trilobar coaptation with greater than 5 cm prostatic length.  A 550 m laser fiber was then brought in and using settings of 2 J's and 60 Hz, 2 incisions were created at the 5:00 and 7:00 positions of the bladder neck on either side of the  median lobe down to the level of the bladder neck/capsular fibers.  The incision was carried down caudally meeting in the midline just above the verumontanum.  The median lobe was then enucleated from a caudal to cranial direction cleaving the adenoma off the underlying capsule rolling it towards the bladder neck and ultimately cleaving the mucosa to free the median lobe into the bladder.   Next, a semilunar incision was created at the prostatic apex on the left side again freeing up the adenoma from the underlying capsule.  Care was taken to avoid any resection past the verumontanum.  This incision was carried around laterally and cranially towards the bladder neck.  Ultimately, I was able to complete the anterior commissure mucosa and the adenoma into the bladder creating a widely patent prostatic fossa.     Next, the same similar incision was created at the right prostatic apex.  This adenoma however ended up being enucleated and more of a piece wise fashion freeing up a large BPH nodules from the capsular fibers.  Once this was completed and cleared from the bladder neck, the prostatic fossa was noted to be widely patent.  Hemostasis was achieved using hemostatic fiber settings.  Bilateral UOs were visualized and free of any injury.  Finally, the 42 French resectoscope was exchanged for nephroscope and using the Piranha handpiece morcellator, the bladder was distended in each of the prostate chips were evacuated.  The bladder was irrigated several times and smaller chips were cleared for the bladder.  This point time, there were no residual fibers appreciated in the bladder.  Hemostasis was adequate.  10 mg of IV Lasix was administered  to help with postoperative diuresis.  A 20 French two-way Foley catheter was then inserted over a catheter guide with 50 cc in the balloon.  The catheter irrigated easily and well.  Patient was then clean and dry, repositioned supine position, reversed from anesthesia, taken  to PACU in stable condition.   Plan: Patient will return to the office in 7 for voiding trial.      Hollice Espy, M.D.

## 2022-08-24 NOTE — Interval H&P Note (Signed)
History and Physical Interval Note:  08/24/2022 7:24 AM  Joseph Esparza  has presented today for surgery, with the diagnosis of Benign Prostatic Hyperplasia with Urinary Obstruction.  The various methods of treatment have been discussed with the patient and family. After consideration of risks, benefits and other options for treatment, the patient has consented to  Procedure(s): [place] WITH MORCELLATION (N/A) as a surgical intervention.  The patient's history has been reviewed, patient examined, no change in status, stable for surgery.  I have reviewed the patient's chart and labs.  Questions were answered to the patient's satisfaction.    RRR CTAB   Hollice Espy

## 2022-08-24 NOTE — Anesthesia Procedure Notes (Signed)
Procedure Name: Intubation Date/Time: 08/24/2022 7:58 AM  Performed by: Adalberto Ill, CRNAPre-anesthesia Checklist: Patient identified, Emergency Drugs available, Suction available, Patient being monitored and Timeout performed Patient Re-evaluated:Patient Re-evaluated prior to induction Oxygen Delivery Method: Circle system utilized Preoxygenation: Pre-oxygenation with 100% oxygen Induction Type: IV induction Ventilation: Mask ventilation without difficulty and Oral airway inserted - appropriate to patient size Laryngoscope Size: Sabra Heck and 2 Grade View: Grade I Tube type: Oral Tube size: 7.0 mm Number of attempts: 1 Airway Equipment and Method: Stylet Placement Confirmation: ETT inserted through vocal cords under direct vision, positive ETCO2 and breath sounds checked- equal and bilateral Secured at: 24 cm Tube secured with: Tape (bilateral before DVL) Dental Injury: Teeth and Oropharynx as per pre-operative assessment

## 2022-08-25 ENCOUNTER — Encounter: Payer: Self-pay | Admitting: Urology

## 2022-08-25 LAB — SURGICAL PATHOLOGY

## 2022-08-25 NOTE — Anesthesia Postprocedure Evaluation (Signed)
Anesthesia Post Note  Patient: Joseph Esparza  Procedure(s) Performed: HOLEP-LASER ENUCLEATION OF THE PROSTATE WITH MORCELLATION  Patient location during evaluation: PACU Anesthesia Type: General Level of consciousness: awake and alert Pain management: pain level controlled Vital Signs Assessment: post-procedure vital signs reviewed and stable Respiratory status: spontaneous breathing, nonlabored ventilation and respiratory function stable Cardiovascular status: blood pressure returned to baseline and stable Postop Assessment: no apparent nausea or vomiting Anesthetic complications: no   No notable events documented.   Last Vitals:  Vitals:   08/24/22 1030 08/24/22 1045  BP: 126/75 113/78  Pulse: 68 61  Resp: 15 16  Temp: (!) 36.1 C (!) 36.2 C  SpO2: 96% 97%    Last Pain:  Vitals:   08/25/22 0908  TempSrc:   PainSc: 0-No pain                 [place] Ouch

## 2022-08-30 ENCOUNTER — Other Ambulatory Visit: Payer: Self-pay | Admitting: Urology

## 2022-08-30 NOTE — Progress Notes (Unsigned)
Catheter Removal  Patient is present today for a catheter removal after HoLEP on 08/24/2022 with Dr. Erlene Quan.  50 ml of water was drained from the balloon. A 20 FR foley cath was removed from the bladder, no complications were noted. Patient tolerated well.  Performed by: Zara Council, PA-C   Follow up/ Additional notes: Surgical pathology  - NO EVIDENCE OF SIGNIFICANT ATYPIA OR MALIGNANCY.  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   Shared results with patient.  PVR this afternoon.  PVR 170 mL  RTC in 12 weeks for I PSS/PVR.    Reviewed return precautions.

## 2022-08-31 ENCOUNTER — Encounter: Payer: Self-pay | Admitting: Urology

## 2022-08-31 ENCOUNTER — Other Ambulatory Visit: Payer: Self-pay | Admitting: Urology

## 2022-08-31 ENCOUNTER — Ambulatory Visit (INDEPENDENT_AMBULATORY_CARE_PROVIDER_SITE_OTHER): Payer: Medicare Other | Admitting: Urology

## 2022-08-31 ENCOUNTER — Ambulatory Visit: Payer: Medicare Other | Admitting: Urology

## 2022-08-31 VITALS — BP 132/75 | HR 88 | Ht 70.0 in | Wt 170.0 lb

## 2022-08-31 DIAGNOSIS — N401 Enlarged prostate with lower urinary tract symptoms: Secondary | ICD-10-CM

## 2022-08-31 DIAGNOSIS — N138 Other obstructive and reflux uropathy: Secondary | ICD-10-CM

## 2022-08-31 DIAGNOSIS — R338 Other retention of urine: Secondary | ICD-10-CM

## 2022-08-31 LAB — BLADDER SCAN AMB NON-IMAGING

## 2022-09-28 ENCOUNTER — Other Ambulatory Visit: Payer: Self-pay | Admitting: Urology

## 2022-11-20 NOTE — Progress Notes (Deleted)
11/23/2022 9:49 AM   Joseph Esparza 09-Apr-1946 [id number]  Referring provider: Barbaraann Boys, MD [address] [place],  [place] [address]  Urological history: 1. Elevated PSA -urology at [facility] January 2019 for a PSA of 8.88.  Prior negative biopsy 7-8 years prior to that visit.  A PHI was ordered which was low at 24.8 and he elected surveillance.  Follow-up PHI 07/2018 was 42.0 (33.3% probability of cancer) -Last PSA December 2021 at 9.69 -HoLEP chips (07/2022) negative  2. BPH with rentention -s/p HoLEP (07/2022) -cysto (06/2022) Prominent lateral lobe enlargement with hypervascularity prostate -Mild elevation bladder neck - Mild trabeculation  -TRUS (06/2022) 107 cc -I PSS *** -PVR ***  No chief complaint on file.   HPI: Joseph Esparza is a 77 y.o. male who presents today for follow up.   I PSS *** PVR ***    Score:  1-7 Mild 8-19 Moderate 20-35 Severe    PMH: Past Medical History:  Diagnosis Date   Anginal pain (HCC)    BPH (benign prostatic hyperplasia)    Cardiomyopathy ([place]) 05/01/2017   a.) STEMI 05/01/2017 --> STEMI --> EF 35-45%. b.) TTE 05/02/2017: EF 40-45%. c.) TTE 09/02/2017: EF 45%.   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 ([place]) 05/01/2017   a.) LHC 05/01/2017: EF 35-45%; anteroapical AK; 100% mLAD --> PCI placing a 3.25 x 23 mm Xience Alpine DES x 1    Surgical History: Past Surgical History:   Procedure Laterality Date   COLONOSCOPY WITH PROPOFOL N/A 02/03/2021   Procedure: COLONOSCOPY WITH PROPOFOL;  Surgeon: Lesly Rubenstein, MD;  Location: [facility] END[facility];  Service: Endoscopy;  Laterality: N/A;   CORONARY BALLOON ANGIOPLASTY N/A 05/01/2017   Procedure: CORONARY BALLOON ANGIOPLASTY;  Surgeon: Yolonda Kida, MD;  Location: [place] [facility];  Service: Cardiovascular;  Laterality: N/A;   CORONARY/GRAFT ACUTE MI REVASCULARIZATION N/A 05/01/2017   Procedure: Coronary/Graft Acute MI Revascularization;  Surgeon: Yolonda Kida, MD;  Location: [place] [facility];  Service: Cardiovascular;  Laterality: N/A;   HOLEP-LASER ENUCLEATION OF THE PROSTATE WITH MORCELLATION N/A 08/24/2022   Procedure: HOLEP-LASER ENUCLEATION OF THE PROSTATE WITH MORCELLATION;  Surgeon: Hollice Espy, MD;  Location: [facility] ORS;  Service: Urology;  Laterality: N/A;   INSERTION OF MESH  03/11/2022   Procedure: INSERTION OF MESH;  Surgeon: Ronny Bacon, MD;  Location: [facility] ORS;  Service: General;;   LEFT HEART CATH AND CORONARY ANGIOGRAPHY N/A 05/01/2017   Procedure: LEFT HEART CATH AND CORONARY ANGIOGRAPHY;  Surgeon: Yolonda Kida, MD;  Location: [place] [facility];  Service: Cardiovascular;  Laterality: N/A;   VASECTOMY N/A     Home Medications:  Allergies as of 11/23/2022   No Known Allergies      Medication List        Accurate as of November 20, 2022  9:49 AM. If you have any questions, ask your nurse or doctor.          aspirin 81 MG chewable tablet Chew 1 tablet (81 mg total) by mouth daily.   atorvastatin 80 MG tablet Commonly known as: LIPITOR Take 1 tablet (80 mg total) by mouth daily at 6 PM. What changed: when to take this   latanoprost 0.005 % ophthalmic solution Commonly known as: XALATAN Place 1 drop into both eyes at bedtime.   losartan 25 MG tablet Commonly known as: COZAAR Take 25 mg by mouth every morning.   metoprolol succinate 25 MG 24 hr  tablet Commonly known as: TOPROL-XL Take 25 mg by mouth daily.   oxybutynin 5 MG tablet Commonly known as: DITROPAN Take 1 tablet (5 mg total) by mouth every 8 (eight) hours as needed for bladder spasms.   tamsulosin 0.4 MG Caps capsule Commonly known as: FLOMAX TAKE 1 CAPSULE BY MOUTH DAILY        Allergies: No Known Allergies  Family History: Family History  Problem Relation Age of Onset   Heart disease Mother    Heart disease Father     Social History:  reports that he quit smoking about 59 years ago. His smoking use included cigarettes. He has a 5.00 pack-year smoking history. He has never used smokeless tobacco. He reports current alcohol use of about 3.0 standard drinks of alcohol per week. He reports that he does not use drugs.  ROS: Pertinent ROS in HPI  Physical Exam: There were no vitals taken for this visit.  Constitutional:  Well nourished. Alert and oriented, No acute distress. HEENT: [place] AT, moist mucus membranes.  Trachea midline, no masses. Cardiovascular: No clubbing, cyanosis, or edema. Respiratory: Normal respiratory effort, no increased work of breathing. GI: Abdomen is soft, non tender, non distended, no abdominal masses. Liver and spleen not palpable.  No hernias appreciated.  Stool sample for occult testing is not indicated.   GU: No CVA tenderness.  No bladder fullness or masses.  Patient with circumcised/uncircumcised phallus. ***Foreskin easily retracted***  Urethral meatus is patent.  No penile discharge. No penile lesions or rashes. Scrotum without lesions, cysts, rashes and/or edema.  Testicles are located scrotally bilaterally. No masses are appreciated in the testicles. Left and right epididymis are normal. Rectal: Patient with  normal sphincter tone. Anus and perineum without scarring or rashes. No rectal masses are appreciated. Prostate is approximately *** grams, *** nodules are appreciated. Seminal vesicles are normal. Skin: No rashes, bruises  or suspicious lesions. Lymph: No cervical or inguinal adenopathy. Neurologic: Grossly intact, no focal deficits, moving all 4 extremities. Psychiatric: Normal mood and affect.  Laboratory Data: N/A  Pertinent Imaging: ***  Assessment & Plan:  ***  1. BPH with LU TS -s/p HoLEP (2023)  -PSA stable *** -DRE benign *** -UA benign *** -PVR < 300 cc *** -symptoms - *** -most bothersome symptoms are *** -continue conservative management, avoiding bladder irritants and timed voiding's -Initiate alpha-blocker (***), discussed side effects *** -Initiate 5 alpha reductase inhibitor (***), discussed side effects *** -Continue tamsulosin 0.4 mg daily, alfuzosin 10 mg daily, Rapaflo 8 mg daily, terazosin, doxazosin, Cialis 5 mg daily and finasteride 5 mg daily, dutasteride 0.5 mg daily***:refills given -Cannot tolerate medication or medication failure, schedule cystoscopy ***  2. Elevated PSA -HoLEP (2023) pathology negative  -discontinue screening per AUA guidelines     No follow-ups on file.  These notes generated with voice recognition software. I  apologize for typographical errors.  [place], [place] [address]  [place], [place] [address] [phone]

## 2022-11-23 ENCOUNTER — Ambulatory Visit: Payer: Medicare Other | Admitting: Urology

## 2022-11-23 DIAGNOSIS — R972 Elevated prostate specific antigen [PSA]: Secondary | ICD-10-CM

## 2022-11-23 DIAGNOSIS — N401 Enlarged prostate with lower urinary tract symptoms: Secondary | ICD-10-CM

## 2024-03-10 ENCOUNTER — Encounter: Payer: Self-pay | Admitting: Ophthalmology

## 2024-03-17 NOTE — Discharge Instructions (Signed)

## 2024-03-20 ENCOUNTER — Ambulatory Visit
Admission: RE | Admit: 2024-03-20 | Discharge: 2024-03-20 | Disposition: A | Discharge status: Home / Self Care | Attending: Ophthalmology | Admitting: Ophthalmology

## 2024-03-20 ENCOUNTER — Other Ambulatory Visit: Payer: Self-pay

## 2024-03-20 ENCOUNTER — Encounter
Admission: RE | Disposition: A | Discharge status: Home / Self Care | Payer: Self-pay | Source: Home / Self Care | Attending: Ophthalmology

## 2024-03-20 ENCOUNTER — Ambulatory Visit: Payer: Self-pay | Admitting: Anesthesiology

## 2024-03-20 ENCOUNTER — Encounter: Payer: Self-pay | Admitting: Ophthalmology

## 2024-03-20 DIAGNOSIS — I25118 Atherosclerotic heart disease of native coronary artery with other forms of angina pectoris: Secondary | ICD-10-CM | POA: Diagnosis not present

## 2024-03-20 DIAGNOSIS — I10 Essential (primary) hypertension: Secondary | ICD-10-CM | POA: Insufficient documentation

## 2024-03-20 DIAGNOSIS — Z87891 Personal history of nicotine dependence: Secondary | ICD-10-CM | POA: Diagnosis not present

## 2024-03-20 DIAGNOSIS — H2511 Age-related nuclear cataract, right eye: Secondary | ICD-10-CM | POA: Diagnosis present

## 2024-03-20 DIAGNOSIS — E1136 Type 2 diabetes mellitus with diabetic cataract: Secondary | ICD-10-CM | POA: Diagnosis not present

## 2024-03-20 HISTORY — DX: Presence of coronary angioplasty implant and graft: Z95.5

## 2024-03-20 HISTORY — PX: CATARACT EXTRACTION W/PHACO: SHX586

## 2024-03-20 SURGERY — PHACOEMULSIFICATION, CATARACT, WITH IOL INSERTION
Anesthesia: Monitor Anesthesia Care | Site: Eye | Laterality: Right

## 2024-03-20 MED ORDER — OXYCODONE HCL 5 MG PO TABS: 5.0000 mg | ORAL_TABLET | Freq: Once | ORAL | Status: DC | PRN

## 2024-03-20 MED ORDER — ARMC OPHTHALMIC DILATING DROPS
1.0000 | OPHTHALMIC | Status: DC | PRN
  Administered 2024-03-20 (×2): 1 via OPHTHALMIC

## 2024-03-20 MED ORDER — MIDAZOLAM HCL 2 MG/2ML IJ SOLN
INTRAMUSCULAR | Status: DC | PRN
Start: 2024-03-20 — End: 2024-03-20
  Administered 2024-03-20: 1 mg via INTRAVENOUS

## 2024-03-20 MED ORDER — MIDAZOLAM HCL 2 MG/2ML IJ SOLN
INTRAMUSCULAR | Status: AC
  Filled 2024-03-20: qty 2

## 2024-03-20 MED ORDER — SIGHTPATH DOSE#1 BSS IO SOLN
INTRAOCULAR | Status: DC | PRN
  Administered 2024-03-20: 80 mL via OPHTHALMIC

## 2024-03-20 MED ORDER — ARMC OPHTHALMIC DILATING DROPS
OPHTHALMIC | Status: AC
  Filled 2024-03-20: qty 0.5

## 2024-03-20 MED ORDER — SIGHTPATH DOSE#1 NA HYALUR & NA CHOND-NA HYALUR IO KIT
PACK | INTRAOCULAR | Status: DC | PRN
  Administered 2024-03-20: 1 via OPHTHALMIC

## 2024-03-20 MED ORDER — FENTANYL CITRATE PF 50 MCG/ML IJ SOSY: 25.0000 ug | PREFILLED_SYRINGE | INTRAMUSCULAR | Status: DC | PRN

## 2024-03-20 MED ORDER — FENTANYL CITRATE (PF) 100 MCG/2ML IJ SOLN
INTRAMUSCULAR | Status: DC | PRN
  Administered 2024-03-20 (×2): 25 ug via INTRAVENOUS

## 2024-03-20 MED ORDER — MOXIFLOXACIN HCL 0.5 % OP SOLN
OPHTHALMIC | Status: DC | PRN
  Administered 2024-03-20: .2 mL via OPHTHALMIC

## 2024-03-20 MED ORDER — DROPERIDOL 2.5 MG/ML IJ SOLN: 0.6250 mg | Freq: Once | INTRAMUSCULAR | Status: DC | PRN

## 2024-03-20 MED ORDER — TETRACAINE HCL 0.5 % OP SOLN
OPHTHALMIC | Status: AC
  Filled 2024-03-20: qty 4

## 2024-03-20 MED ORDER — SIGHTPATH DOSE#1 BSS IO SOLN
INTRAOCULAR | Status: DC | PRN
Start: 2024-03-20 — End: 2024-03-20
  Administered 2024-03-20: 15 mL via INTRAOCULAR

## 2024-03-20 MED ORDER — FENTANYL CITRATE (PF) 100 MCG/2ML IJ SOLN
INTRAMUSCULAR | Status: AC
  Filled 2024-03-20: qty 2

## 2024-03-20 MED ORDER — SODIUM CHLORIDE 0.9% FLUSH
INTRAVENOUS | Status: DC | PRN
Start: 2024-03-20 — End: 2024-03-20
  Administered 2024-03-20 (×2): 10 mL via INTRAVENOUS

## 2024-03-20 MED ORDER — TETRACAINE HCL 0.5 % OP SOLN
1.0000 [drp] | OPHTHALMIC | Status: DC | PRN
  Administered 2024-03-20 (×4): 1 [drp] via OPHTHALMIC

## 2024-03-20 MED ORDER — LACTATED RINGERS IV SOLN: INTRAVENOUS | Status: DC

## 2024-03-20 MED ORDER — OXYCODONE HCL 5 MG/5ML PO SOLN: 5.0000 mg | Freq: Once | ORAL | Status: DC | PRN

## 2024-03-20 MED ORDER — LIDOCAINE HCL (PF) 2 % IJ SOLN
INTRAOCULAR | Status: DC | PRN
  Administered 2024-03-20: 4 mL via INTRAOCULAR

## 2024-03-20 MED ORDER — ACETAMINOPHEN 10 MG/ML IV SOLN: 1000.0000 mg | Freq: Once | INTRAVENOUS | Status: DC | PRN

## 2024-03-20 SURGICAL SUPPLY — 12 items
CATARACT SUITE SIGHTPATH (MISCELLANEOUS) ×1 IMPLANT
DISSECTOR HYDRO NUCLEUS 50X22 (MISCELLANEOUS) ×1 IMPLANT
FEE CATARACT SUITE SIGHTPATH (MISCELLANEOUS) ×1 IMPLANT
GLOVE PI ULTRA LF STRL 7.5 (GLOVE) ×1 IMPLANT
GLOVE SURG POLYISOPRENE 8.5 (GLOVE) ×1 IMPLANT
GLOVE SURG PROTEXIS BL SZ6.5 (GLOVE) ×1 IMPLANT
GLOVE SURG SYN 6.5 PF PI BL (GLOVE) ×1 IMPLANT
GLOVE SURG SYN 8.5 PF PI BL (GLOVE) ×1 IMPLANT
LENS IOL TECNIS EYHANCE 13.5 (Intraocular Lens) IMPLANT
NDL FILTER BLUNT 18X1 1/2 (NEEDLE) ×1 IMPLANT
NEEDLE FILTER BLUNT 18X1 1/2 (NEEDLE) ×1 IMPLANT
SYR 3ML LL SCALE MARK (SYRINGE) ×1 IMPLANT

## 2024-03-20 NOTE — Op Note (Signed)
 OPERATIVE NOTE  CAMAURI CRATON [id number] 03/20/2024   PREOPERATIVE DIAGNOSIS:  Nuclear sclerotic cataract right eye.  H25.11   POSTOPERATIVE DIAGNOSIS:    Nuclear sclerotic cataract right eye.     PROCEDURE:  Phacoemusification with posterior chamber intraocular lens placement of the right eye   LENS:   Implant Name Type Inv. Item Serial No. Manufacturer Lot No. LRB No. Used Action  LENS IOL TECNIS EYHANCE 13.5 - [id number] Intraocular Lens LENS IOL TECNIS EYHANCE 13.5 [id number] [employer]  Right 1 Implanted       Procedure(s): PHACOEMULSIFICATION, CATARACT, WITH IOL INSERTION 11.06 00:56.4 (Right)  SURGEON:  Adine Novak, MD, MPH  ANESTHESIOLOGIST: Anesthesiologist: Myra Lynwood MATSU, MD CRNA: Jarvis Lew, CRNA   ANESTHESIA:  Topical with tetracaine drops augmented with 1% preservative-free intracameral lidocaine .  ESTIMATED BLOOD LOSS: less than 1 mL.   COMPLICATIONS:  None.   DESCRIPTION OF PROCEDURE:  The patient was identified in the holding room and transported to the operating room and placed in the supine position under the operating microscope.  The right eye was identified as the operative eye and it was prepped and draped in the usual sterile ophthalmic fashion.   A 1.0 millimeter clear-corneal paracentesis was made at the 10:30 position. 0.5 ml of preservative-free 1% lidocaine  with epinephrine  was injected into the anterior chamber.  The anterior chamber was filled with viscoelastic.  A 2.4 millimeter keratome was used to make a near-clear corneal incision at the 8:00 position.  A curvilinear capsulorrhexis was made with a cystotome and capsulorrhexis forceps.  Balanced salt solution was used to hydrodissect and hydrodelineate the nucleus.   Phacoemulsification was then used in stop and chop fashion to remove the lens nucleus and epinucleus.  The remaining cortex was then removed using the irrigation and aspiration handpiece. Viscoelastic was then placed into the  capsular bag to distend it for lens placement.  A lens was then injected into the capsular bag.  The remaining viscoelastic was aspirated.   Wounds were hydrated with balanced salt solution.  The anterior chamber was inflated to a physiologic pressure with balanced salt solution.   Intracameral vigamox 0.1 mL undiluted was injected into the eye and a drop placed onto the ocular surface.  No wound leaks were noted.  The patient was taken to the recovery room in stable condition without complications of anesthesia or surgery  Adine Novak 03/20/2024, 11:56 AM

## 2024-03-20 NOTE — H&P (Signed)
 [facility]   Primary Care Physician:  Delfina Pao, MD Ophthalmologist: Dr. Adine Novak  Pre-Procedure History & Physical: HPI:  Joseph Esparza is a 78 y.o. male here for cataract surgery.   Past Medical History:  Diagnosis Date   Anginal pain (HCC)    BPH (benign prostatic hyperplasia)    Cardiomyopathy (HCC) 05/01/2017   a.) STEMI 05/01/2017 --> STEMI --> EF 35-45%. b.) TTE 05/02/2017: EF 40-45%. c.) TTE 09/02/2017: EF 45%.   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) 05/01/2017   a.) LHC 05/01/2017: EF 35-45%; anteroapical AK; 100% mLAD --> PCI placing a 3.25 x 23 mm Xience Alpine DES x 1    Past Surgical History:  Procedure Laterality Date   COLONOSCOPY WITH PROPOFOL  N/A 02/03/2021   Procedure: COLONOSCOPY WITH PROPOFOL ;  Surgeon: Maryruth Ole DASEN, MD;  Location: [facility];  Service: Endoscopy;  Laterality: N/A;   CORONARY BALLOON ANGIOPLASTY N/A 05/01/2017   Procedure: CORONARY BALLOON ANGIOPLASTY;  Surgeon: Florencio Cara BIRCH, MD;  Location: [facility];  Service: Cardiovascular;  Laterality: N/A;   CORONARY/GRAFT ACUTE MI REVASCULARIZATION N/A 05/01/2017   Procedure: Coronary/Graft Acute MI Revascularization;  Surgeon: Florencio Cara BIRCH, MD;  Location: [facility];  Service: Cardiovascular;  Laterality: N/A;   HOLEP-LASER ENUCLEATION OF THE PROSTATE WITH  MORCELLATION N/A 08/24/2022   Procedure: HOLEP-LASER ENUCLEATION OF THE PROSTATE WITH MORCELLATION;  Surgeon: Penne Knee, MD;  Location: [facility];  Service: Urology;  Laterality: N/A;   INSERTION OF MESH  03/11/2022   Procedure: INSERTION OF MESH;  Surgeon: Lane Shope, MD;  Location: [facility];  Service: General;;   LEFT HEART CATH AND CORONARY ANGIOGRAPHY N/A 05/01/2017   Procedure: LEFT HEART CATH AND CORONARY ANGIOGRAPHY;  Surgeon: Florencio Cara BIRCH, MD;  Location: [facility];  Service: Cardiovascular;  Laterality: N/A;   VASECTOMY N/A     Prior to Admission medications   Medication Sig Start Date End Date Taking? Authorizing Provider  aspirin  81 MG chewable tablet Chew 1 tablet (81 mg total) by mouth daily. 05/04/17  Yes Kasa, Kurian, MD  atorvastatin  (LIPITOR) 80 MG tablet Take 1 tablet (80 mg total) by mouth daily at 6 PM. Patient taking differently: Take 80 mg by mouth every morning. 05/04/17  Yes Kasa, Kurian, MD  dorzolamide (TRUSOPT) 2 % ophthalmic solution Place 1 drop into both eyes 3 (three) times daily.   Yes [provider]  latanoprost (XALATAN) 0.005 % ophthalmic solution Place 1 drop into both eyes at bedtime.   Yes [provider]  losartan (COZAAR) 25 MG tablet Take 25 mg by mouth every morning.   Yes [provider]  metoprolol  succinate (TOPROL -XL) 25 MG 24 hr tablet Take 25 mg by mouth daily.   Yes [provider]  oxybutynin  (DITROPAN ) 5 MG tablet Take 1 tablet (5 mg total) by mouth every 8 (eight) hours as needed for bladder spasms. 08/24/22  Yes Penne Knee, MD  tamsulosin  (FLOMAX ) 0.4 MG CAPS capsule TAKE 1 CAPSULE BY MOUTH DAILY 09/29/22  Yes Stoioff, Glendia BROCKS, MD    Allergies as of 02/29/2024   (No Known Allergies)    Family History  Problem Relation Age of Onset   Heart disease Mother    Heart disease Father     Social History   Socioeconomic History   Marital status: Single    Spouse name: Not on  file   Number of children: 2   Years of education: Not on file   Highest education level: Not on file  Occupational History    Employer: SELF EMPLOYED  Tobacco Use   Smoking status: Former    Current packs/day: 0.00    Average packs/day: 0.5 packs/day for 10.0 years (5.0 ttl pk-yrs)    Types: Cigarettes    Start date: 09/28/1953    Quit date: 09/29/1963    Years since quitting: 60.5   Smokeless tobacco: Never   Tobacco comments:    Quit in his 20's  smoked maybe 10 years  Vaping Use   Vaping status: Never Used  Substance and Sexual Activity   Alcohol use: Yes    Alcohol/week: 3.0 standard drinks of alcohol    Types: 3 Cans of beer per week    Comment: occ   Drug use: No   Sexual activity: Yes    Partners: Female    Birth control/protection: Surgical  Other Topics Concern   Not on file  Social History Narrative   Lives alone   Social Drivers of Health   Financial Resource Strain: Low Risk  (02/29/2024)   Received from [facility]   Overall Financial Resource Strain (CARDIA)    Difficulty of Paying Living Expenses: Not hard at all  Food Insecurity: No Food Insecurity (02/29/2024)   Received from [facility]   Hunger Vital Sign    Within the past 12 months, you worried that your food would run out before you got the money to buy more.: Never true    Within the past 12 months, the food you bought just didn't last and you didn't have money to get more.: Never true  Transportation Needs: No Transportation Needs (02/29/2024)   Received from [facility] - Transportation    In the past 12 months, has lack of transportation kept you from medical appointments or from getting medications?: No    Lack of Transportation (Non-Medical): No  Physical Activity: Unknown (08/10/2017)   Received from [facility]   Exercise Vital Sign    Days of Exercise per Week: Patient declined    Minutes of Exercise per  Session: Patient declined  Stress: Unknown (08/10/2017)   Received from [facility] - Occupational Stress Questionnaire    Feeling of Stress : Patient declined  Social Connections: Unknown (08/10/2017)   Received from [facility]   Social Connection and Isolation Panel    Frequency of Communication with Friends and Family: Patient declined    Frequency of Social Gatherings with Friends and Family: Patient declined    Attends Religious Services: Patient declined    Active Member of Clubs or Organizations: Patient declined    Attends Banker Meetings: Patient  declined    Marital Status: Patient declined  Intimate Partner Violence: Not on file    Review of Systems: See HPI, otherwise negative ROS  Physical Exam: BP 133/79   Temp 97.9 F (36.6 C)   Resp 17   Ht 5' 9.02 (1.753 m)   Wt 77.3 kg   SpO2 96%   BMI 25.15 kg/m  General:   Alert, cooperative. Head:  Normocephalic and atraumatic. Respiratory:  Normal work of breathing. Cardiovascular:  NAD  Impression/Plan: Joseph Esparza is here for cataract surgery.  Risks, benefits, limitations, and alternatives regarding cataract surgery have been reviewed with the patient.  Questions have been answered.  All parties agreeable.   Adine Novak, MD  03/20/2024, 11:30 AM

## 2024-03-20 NOTE — Anesthesia Preprocedure Evaluation (Signed)
 Anesthesia Evaluation  Patient identified by MRN, date of birth, ID band Patient awake    Reviewed: Allergy & Precautions, H&P , NPO status , Patient's Chart, lab work & pertinent test results, reviewed documented beta blocker date and time   Airway Mallampati: II  TM Distance: >3 FB Neck ROM: full    Dental no notable dental hx. (+) Teeth Intact   Pulmonary neg pulmonary ROS, former smoker   Pulmonary exam normal breath sounds clear to auscultation       Cardiovascular Exercise Tolerance: Good hypertension, + angina with exertion + CAD and + Past MI   Rhythm:regular Rate:Normal     Neuro/Psych negative neurological ROS  negative psych ROS   GI/Hepatic Neg liver ROS,GERD  Medicated,,  Endo/Other  negative endocrine ROSdiabetes, Well Controlled    Renal/GU      Musculoskeletal   Abdominal   Peds  Hematology negative hematology ROS (+)   Anesthesia Other Findings   Reproductive/Obstetrics negative OB ROS                             Anesthesia Physical Anesthesia Plan  ASA: 3  Anesthesia Plan: MAC   Post-op Pain Management:    Induction:   PONV Risk Score and Plan:   Airway Management Planned:   Additional Equipment:   Intra-op Plan:   Post-operative Plan:   Informed Consent: I have reviewed the patients History and Physical, chart, labs and discussed the procedure including the risks, benefits and alternatives for the proposed anesthesia with the patient or authorized representative who has indicated his/her understanding and acceptance.       Plan Discussed with: CRNA  Anesthesia Plan Comments:        Anesthesia Quick Evaluation

## 2024-03-20 NOTE — Anesthesia Postprocedure Evaluation (Signed)
 Anesthesia Post Note  Patient: Joseph Esparza  Procedure(s) Performed: PHACOEMULSIFICATION, CATARACT, WITH IOL INSERTION 11.06 00:56.4 (Right: Eye)  Patient location during evaluation: PACU Anesthesia Type: MAC Level of consciousness: awake and alert Pain management: pain level controlled Vital Signs Assessment: post-procedure vital signs reviewed and stable Respiratory status: spontaneous breathing, nonlabored ventilation, respiratory function stable and patient connected to nasal cannula oxygen Cardiovascular status: stable and blood pressure returned to baseline Postop Assessment: no apparent nausea or vomiting Anesthetic complications: no   No notable events documented.   Last Vitals:  Vitals:   03/20/24 1200 03/20/24 1203  BP: 117/68 106/74  Pulse: (!) 32 (!) 59  Resp: 15 (!) 21  Temp:    SpO2: 96% 95%    Last Pain:  Vitals:   03/20/24 1203  PainSc: 0-No pain                 Lynwood KANDICE Clause

## 2024-03-20 NOTE — Transfer of Care (Signed)
 Immediate Anesthesia Transfer of Care Note  Patient: Joseph Esparza  Procedure(s) Performed: PHACOEMULSIFICATION, CATARACT, WITH IOL INSERTION 11.06 00:56.4 (Right: Eye)  Patient Location: PACU  Anesthesia Type:MAC  Level of Consciousness: awake and alert   Airway & Oxygen Therapy: Patient Spontanous Breathing  Post-op Assessment: Report given to RN and Post -op Vital signs reviewed and stable  Post vital signs: Reviewed and stable  Last Vitals:  Vitals Value Taken Time  BP 107/77 03/20/24 11:57  Temp 36.8 C 03/20/24 11:58  Pulse 32 03/20/24 12:00  Resp 15 03/20/24 12:00  SpO2 96 % 03/20/24 12:00  Vitals shown include unfiled device data.  Last Pain:  Vitals:   03/20/24 1158  PainSc: 0-No pain         Complications: No notable events documented.

## 2024-03-21 ENCOUNTER — Encounter: Payer: Self-pay | Admitting: Ophthalmology

## 2024-03-22 ENCOUNTER — Encounter: Payer: Self-pay | Admitting: Ophthalmology

## 2024-03-24 NOTE — Discharge Instructions (Signed)

## 2024-03-27 ENCOUNTER — Encounter
Admission: RE | Disposition: A | Discharge status: Home / Self Care | Payer: Self-pay | Source: Home / Self Care | Attending: Ophthalmology

## 2024-03-27 ENCOUNTER — Ambulatory Visit: Payer: Self-pay | Admitting: Anesthesiology

## 2024-03-27 ENCOUNTER — Other Ambulatory Visit: Payer: Self-pay

## 2024-03-27 ENCOUNTER — Encounter: Payer: Self-pay | Admitting: Ophthalmology

## 2024-03-27 ENCOUNTER — Ambulatory Visit
Admission: RE | Admit: 2024-03-27 | Discharge: 2024-03-27 | Disposition: A | Discharge status: Home / Self Care | Attending: Ophthalmology | Admitting: Ophthalmology

## 2024-03-27 DIAGNOSIS — H2512 Age-related nuclear cataract, left eye: Secondary | ICD-10-CM | POA: Diagnosis present

## 2024-03-27 DIAGNOSIS — I081 Rheumatic disorders of both mitral and tricuspid valves: Secondary | ICD-10-CM | POA: Insufficient documentation

## 2024-03-27 DIAGNOSIS — I10 Essential (primary) hypertension: Secondary | ICD-10-CM | POA: Diagnosis not present

## 2024-03-27 DIAGNOSIS — Z955 Presence of coronary angioplasty implant and graft: Secondary | ICD-10-CM | POA: Diagnosis not present

## 2024-03-27 DIAGNOSIS — I252 Old myocardial infarction: Secondary | ICD-10-CM | POA: Insufficient documentation

## 2024-03-27 DIAGNOSIS — Z87891 Personal history of nicotine dependence: Secondary | ICD-10-CM | POA: Diagnosis not present

## 2024-03-27 DIAGNOSIS — K219 Gastro-esophageal reflux disease without esophagitis: Secondary | ICD-10-CM | POA: Insufficient documentation

## 2024-03-27 DIAGNOSIS — I2510 Atherosclerotic heart disease of native coronary artery without angina pectoris: Secondary | ICD-10-CM | POA: Diagnosis not present

## 2024-03-27 HISTORY — PX: CATARACT EXTRACTION W/PHACO: SHX586

## 2024-03-27 SURGERY — PHACOEMULSIFICATION, CATARACT, WITH IOL INSERTION
Anesthesia: Monitor Anesthesia Care | Site: Eye | Laterality: Left

## 2024-03-27 MED ORDER — MIDAZOLAM HCL 2 MG/2ML IJ SOLN
INTRAMUSCULAR | Status: AC
Start: 2024-03-27 — End: 2024-03-27
  Filled 2024-03-27: qty 2

## 2024-03-27 MED ORDER — LIDOCAINE HCL (PF) 2 % IJ SOLN
INTRAOCULAR | Status: DC | PRN
  Administered 2024-03-27: 1 mL via INTRAOCULAR

## 2024-03-27 MED ORDER — ARMC OPHTHALMIC DILATING DROPS
1.0000 | OPHTHALMIC | Status: DC | PRN
  Administered 2024-03-27 (×3): 1 via OPHTHALMIC

## 2024-03-27 MED ORDER — FENTANYL CITRATE (PF) 100 MCG/2ML IJ SOLN
INTRAMUSCULAR | Status: DC | PRN
  Administered 2024-03-27: 50 ug via INTRAVENOUS

## 2024-03-27 MED ORDER — TETRACAINE HCL 0.5 % OP SOLN
1.0000 [drp] | OPHTHALMIC | Status: DC | PRN
  Administered 2024-03-27 (×3): 1 [drp] via OPHTHALMIC

## 2024-03-27 MED ORDER — SIGHTPATH DOSE#1 BSS IO SOLN
INTRAOCULAR | Status: DC | PRN
  Administered 2024-03-27: 97 mL via OPHTHALMIC

## 2024-03-27 MED ORDER — SIGHTPATH DOSE#1 BSS IO SOLN
INTRAOCULAR | Status: DC | PRN
Start: 2024-03-27 — End: 2024-03-27
  Administered 2024-03-27: 15 mL

## 2024-03-27 MED ORDER — LACTATED RINGERS IV SOLN: INTRAVENOUS | Status: DC

## 2024-03-27 MED ORDER — ARMC OPHTHALMIC DILATING DROPS
OPHTHALMIC | Status: AC
  Filled 2024-03-27: qty 0.5

## 2024-03-27 MED ORDER — MIDAZOLAM HCL 2 MG/2ML IJ SOLN
INTRAMUSCULAR | Status: DC | PRN
Start: 2024-03-27 — End: 2024-03-27
  Administered 2024-03-27: 1 mg via INTRAVENOUS

## 2024-03-27 MED ORDER — FENTANYL CITRATE (PF) 100 MCG/2ML IJ SOLN
INTRAMUSCULAR | Status: AC
  Filled 2024-03-27: qty 2

## 2024-03-27 MED ORDER — MOXIFLOXACIN HCL 0.5 % OP SOLN
OPHTHALMIC | Status: DC | PRN
  Administered 2024-03-27: .2 mL via OPHTHALMIC

## 2024-03-27 MED ORDER — TETRACAINE HCL 0.5 % OP SOLN
OPHTHALMIC | Status: AC
  Filled 2024-03-27: qty 4

## 2024-03-27 MED ORDER — SIGHTPATH DOSE#1 NA HYALUR & NA CHOND-NA HYALUR IO KIT
PACK | INTRAOCULAR | Status: DC | PRN
  Administered 2024-03-27: 1 via OPHTHALMIC

## 2024-03-27 SURGICAL SUPPLY — 12 items
CATARACT SUITE SIGHTPATH (MISCELLANEOUS) ×1 IMPLANT
DISSECTOR HYDRO NUCLEUS 50X22 (MISCELLANEOUS) ×1 IMPLANT
FEE CATARACT SUITE SIGHTPATH (MISCELLANEOUS) ×1 IMPLANT
GLOVE PI ULTRA LF STRL 7.5 (GLOVE) ×1 IMPLANT
GLOVE SURG POLYISOPRENE 8.5 (GLOVE) ×1 IMPLANT
GLOVE SURG PROTEXIS BL SZ6.5 (GLOVE) ×1 IMPLANT
GLOVE SURG SYN 6.5 PF PI BL (GLOVE) ×1 IMPLANT
GLOVE SURG SYN 8.5 PF PI BL (GLOVE) ×1 IMPLANT
LENS IOL TECNIS EYHANCE 13.0 (Intraocular Lens) IMPLANT
NDL FILTER BLUNT 18X1 1/2 (NEEDLE) ×1 IMPLANT
NEEDLE FILTER BLUNT 18X1 1/2 (NEEDLE) ×1 IMPLANT
SYR 3ML LL SCALE MARK (SYRINGE) ×1 IMPLANT

## 2024-03-27 NOTE — Anesthesia Postprocedure Evaluation (Signed)
 Anesthesia Post Note  Patient: Joseph Esparza  Procedure(s) Performed: PHACOEMULSIFICATION, CATARACT, WITH IOL INSERTION (Left: Eye)  Patient location during evaluation: PACU Anesthesia Type: MAC Level of consciousness: awake and alert Pain management: pain level controlled Vital Signs Assessment: post-procedure vital signs reviewed and stable Respiratory status: spontaneous breathing, nonlabored ventilation, respiratory function stable and patient connected to nasal cannula oxygen Cardiovascular status: stable and blood pressure returned to baseline Postop Assessment: no apparent nausea or vomiting Anesthetic complications: no   No notable events documented.   Last Vitals:  Vitals:   03/27/24 1027 03/27/24 1030  BP: 96/68 114/67  Pulse: (!) 55 (!) 56  Resp: (!) 7 17  Temp: 36.6 C 36.6 C  SpO2: 97% 94%    Last Pain:  Vitals:   03/27/24 1030  TempSrc:   PainSc: 0-No pain                 Sharnetta Gielow C Cresencia Asmus

## 2024-03-27 NOTE — Op Note (Signed)
 OPERATIVE NOTE  Joseph Esparza [id number] 03/27/2024   PREOPERATIVE DIAGNOSIS:  Nuclear sclerotic cataract left eye.  H25.12   POSTOPERATIVE DIAGNOSIS:    Nuclear sclerotic cataract left eye.     PROCEDURE:  Phacoemusification with posterior chamber intraocular lens placement of the left eye   LENS:  * No implants in log *    Procedure(s) with comments: PHACOEMULSIFICATION, CATARACT, WITH IOL INSERTION (Left) - 10.83 0:57.2  SURGEON:  Adine Novak, MD, MPH   ANESTHESIA:  Topical with tetracaine  drops augmented with 1% preservative-free intracameral lidocaine .  ESTIMATED BLOOD LOSS: <1 mL   COMPLICATIONS:  None.   DESCRIPTION OF PROCEDURE:  The patient was identified in the holding room and transported to the operating room and placed in the supine position under the operating microscope.  The left eye was identified as the operative eye and it was prepped and draped in the usual sterile ophthalmic fashion.   A 1.0 millimeter clear-corneal paracentesis was made at the 5:00 position. 0.5 ml of preservative-free 1% lidocaine  with epinephrine  was injected into the anterior chamber.  The anterior chamber was filled with viscoelastic.  A 2.4 millimeter keratome was used to make a near-clear corneal incision at the 2:00 position.  A curvilinear capsulorrhexis was made with a cystotome and capsulorrhexis forceps.  Balanced salt solution was used to hydrodissect and hydrodelineate the nucleus.   Phacoemulsification was then used in stop and chop fashion to remove the lens nucleus and epinucleus.  The remaining cortex was then removed using the irrigation and aspiration handpiece. Viscoelastic was then placed into the capsular bag to distend it for lens placement.  A lens was then injected into the capsular bag.  The remaining viscoelastic was aspirated.   Wounds were hydrated with balanced salt solution.  The anterior chamber was inflated to a physiologic pressure with balanced salt  solution.  Intracameral vigamox  0.1 mL undiltued was injected into the eye and a drop placed onto the ocular surface.  No wound leaks were noted.  The patient was taken to the recovery room in stable condition without complications of anesthesia or surgery  Adine Novak 03/27/2024, 10:25 AM

## 2024-03-27 NOTE — H&P (Signed)
 [facility]   Primary Care Physician:  Delfina Pao, MD Ophthalmologist: Dr. Adine Novak  Pre-Procedure History & Physical: HPI:  Joseph Esparza is a 78 y.o. male here for cataract surgery.   Past Medical History:  Diagnosis Date   Anginal pain (HCC)    BPH (benign prostatic hyperplasia)    Cardiomyopathy (HCC) 05/01/2017   a.) STEMI 05/01/2017 --> STEMI --> EF 35-45%. b.) TTE 05/02/2017: EF 40-45%. c.) TTE 09/02/2017: EF 45%.   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) 05/01/2017   a.) LHC 05/01/2017: EF 35-45%; anteroapical AK; 100% mLAD --> PCI placing a 3.25 x 23 mm Xience Alpine DES x 1    Past Surgical History:  Procedure Laterality Date   CATARACT EXTRACTION W/PHACO Right 03/20/2024   Procedure: PHACOEMULSIFICATION, CATARACT, WITH IOL INSERTION 11.06 00:56.4;  Surgeon: Novak Adine Anes, MD;  Location: [facility];  Service: Ophthalmology;  Laterality: Right;   COLONOSCOPY WITH PROPOFOL  N/A 02/03/2021   Procedure: COLONOSCOPY WITH PROPOFOL ;  Surgeon: Maryruth Ole DASEN, MD;  Location: [facility];  Service: Endoscopy;  Laterality: N/A;   CORONARY BALLOON ANGIOPLASTY N/A 05/01/2017   Procedure: CORONARY BALLOON ANGIOPLASTY;  Surgeon: Florencio Cara BIRCH, MD;  Location: [facility];  Service: Cardiovascular;  Laterality: N/A;   CORONARY/GRAFT ACUTE MI REVASCULARIZATION N/A  05/01/2017   Procedure: Coronary/Graft Acute MI Revascularization;  Surgeon: Florencio Cara BIRCH, MD;  Location: [facility];  Service: Cardiovascular;  Laterality: N/A;   HOLEP-LASER ENUCLEATION OF THE PROSTATE WITH MORCELLATION N/A 08/24/2022   Procedure: HOLEP-LASER ENUCLEATION OF THE PROSTATE WITH MORCELLATION;  Surgeon: Penne Knee, MD;  Location: [facility];  Service: Urology;  Laterality: N/A;   INSERTION OF MESH  03/11/2022   Procedure: INSERTION OF MESH;  Surgeon: Lane Shope, MD;  Location: [facility];  Service: General;;   LEFT HEART CATH AND CORONARY ANGIOGRAPHY N/A 05/01/2017   Procedure: LEFT HEART CATH AND CORONARY ANGIOGRAPHY;  Surgeon: Florencio Cara BIRCH, MD;  Location: [facility];  Service: Cardiovascular;  Laterality: N/A;   VASECTOMY N/A     Prior to Admission medications   Medication Sig Start Date End Date Taking? Authorizing Provider  aspirin  81 MG chewable tablet Chew 1 tablet (81 mg total) by mouth daily. 05/04/17  Yes Kasa, Kurian, MD  atorvastatin  (LIPITOR) 80 MG tablet Take 1 tablet (80 mg total) by mouth daily at 6 PM. Patient taking differently: Take 80 mg by mouth every morning. 05/04/17  Yes Kasa, Kurian, MD  dorzolamide (TRUSOPT) 2 % ophthalmic solution Place 1 drop into both eyes 3 (three) times daily.   Yes [provider]  latanoprost (XALATAN) 0.005 % ophthalmic solution Place 1 drop into both eyes at bedtime.   Yes [provider]  losartan (COZAAR) 25  MG tablet Take 25 mg by mouth every morning.   Yes [provider]  metoprolol  succinate (TOPROL -XL) 25 MG 24 hr tablet Take 25 mg by mouth daily.   Yes [provider]  oxybutynin  (DITROPAN ) 5 MG tablet Take 1 tablet (5 mg total) by mouth every 8 (eight) hours as needed for bladder spasms. 08/24/22  Yes Penne Knee, MD  tamsulosin  (FLOMAX ) 0.4 MG CAPS capsule TAKE 1 CAPSULE BY MOUTH DAILY 09/29/22  Yes Stoioff, Glendia BROCKS, MD    Allergies as of  02/29/2024   (No Known Allergies)    Family History  Problem Relation Age of Onset   Heart disease Mother    Heart disease Father     Social History   Socioeconomic History   Marital status: Single    Spouse name: Not on file   Number of children: 2   Years of education: Not on file   Highest education level: Not on file  Occupational History    Employer: SELF EMPLOYED  Tobacco Use   Smoking status: Former    Current packs/day: 0.00    Average packs/day: 0.5 packs/day for 10.0 years (5.0 ttl pk-yrs)    Types: Cigarettes    Start date: 09/28/1953    Quit date: 09/29/1963    Years since quitting: 60.5   Smokeless tobacco: Never   Tobacco comments:    Quit in his 20's  smoked maybe 10 years  Vaping Use   Vaping status: Never Used  Substance and Sexual Activity   Alcohol use: Yes    Alcohol/week: 3.0 standard drinks of alcohol    Types: 3 Cans of beer per week    Comment: occ   Drug use: No   Sexual activity: Yes    Partners: Female    Birth control/protection: Surgical  Other Topics Concern   Not on file  Social History Narrative   Lives alone   Social Drivers of Health   Financial Resource Strain: Low Risk  (02/29/2024)   Received from [facility]   Overall Financial Resource Strain (CARDIA)    Difficulty of Paying Living Expenses: Not hard at all  Food Insecurity: No Food Insecurity (02/29/2024)   Received from [facility]   Hunger Vital Sign    Within the past 12 months, you worried that your food would run out before you got the money to buy more.: Never true    Within the past 12 months, the food you bought just didn't last and you didn't have money to get more.: Never true  Transportation Needs: No Transportation Needs (02/29/2024)   Received from [facility] - Transportation    In the past 12 months, has lack of transportation kept you from medical appointments or from getting medications?: No     Lack of Transportation (Non-Medical): No  Physical Activity: Unknown (08/10/2017)   Received from [facility]   Exercise Vital Sign    Days of Exercise per Week: Patient declined    Minutes of Exercise per Session: Patient declined  Stress: Unknown (08/10/2017)   Received from [facility] - Occupational Stress Questionnaire    Feeling of Stress : Patient declined  Social Connections: Unknown (08/10/2017)   Received from [facility]   Social Connection and Isolation Panel    Frequency of Communication with Friends and Family: Patient declined    Frequency of Social Gatherings  with Friends and Family: Patient declined    Attends Religious Services: Patient declined    Active Member of Clubs or Organizations: Patient declined    Attends Engineer, structural: Patient declined    Marital Status: Patient declined  Catering manager Violence: Not on file    Review of Systems: See HPI, otherwise negative ROS  Physical Exam: BP 130/75   Pulse (!) 52   Temp (!) 97.3 F (36.3 C) (Temporal)   Resp 16   Ht 5' 9.02 (1.753 m)   Wt 77.1 kg   SpO2 97%   BMI 25.08 kg/m  General:   Alert, cooperative. Head:  Normocephalic and atraumatic. Respiratory:  Normal work of breathing. Cardiovascular:  NAD  Impression/Plan: Joseph Esparza is here for cataract surgery.  Risks, benefits, limitations, and alternatives regarding cataract surgery have been reviewed with the patient.  Questions have been answered.  All parties agreeable.   Adine Novak, MD  03/27/2024, 9:55 AM

## 2024-03-27 NOTE — Anesthesia Preprocedure Evaluation (Addendum)
 Anesthesia Evaluation  Patient identified by MRN, date of birth, ID band Patient awake    Reviewed: Allergy & Precautions, H&P , NPO status , Patient's Chart, lab work & pertinent test results  Airway Mallampati: II  TM Distance: >3 FB     Dental   Pulmonary former smoker          Cardiovascular hypertension, + angina  + CAD and + Past MI    I'm a little confused as to whether patient has had NSTEMI, STEMI, or both. Epic records show in 2018, with subsequent PCI and stent  STEMI (ST elevation myocardial infarction) (CMS-HCC) 05/01/2017  s/p DES to mid LAD   echocardiogram 2D complete:(09/02/2017) INTERPRETATION NORMAL LEFT VENTRICULAR SYSTOLIC FUNCTION WITH AN ESTIMATED EF = 45-50 % NORMAL RIGHT VENTRICULAR SYSTOLIC FUNCTION MILD TRICUSPID AND MITRAL VALVE INSUFFICIENCY TRACE AORTIC VALVE INSUFFICIENCY NO VALVULAR STENOSIS MILD RV ENLARGEMENT MILD BIATRIAL ENLARGEMENT  NM Myocardial Perfusion SPECT multiple (stress and rest): (09/02/2017) IMPRESSION: Abnormal myocardial perfusion scan borderline overall left ventricular function around 50% with mild anterior apical hypokinesis there is evidence of anterior apical defect with evidence of reversible ischemia recommend further evaluation possibly cardiac cath   Cardiac Catheterization: (05/01/2017)  A STENT XIENCE ALPINE RX [id number] drug eluting stent was successfully  placed, and does not overlap previously placed stent.   Mid LAD lesion, 100 %stenosed.   Post intervention, there is a 0% residual stenosis.   There is moderate left ventricular systolic dysfunction.   LV end diastolic pressure is mildly elevated.   The left ventricular ejection fraction is 35-45% by visual estimate.        Neuro/Psych negative neurological ROS  negative psych ROS   GI/Hepatic negative GI ROS, Neg liver ROS,GERD  ,,  Endo/Other  negative endocrine ROS    Renal/GU negative Renal ROS   negative genitourinary   Musculoskeletal negative musculoskeletal ROS (+)    Abdominal   Peds negative pediatric ROS (+)  Hematology negative hematology ROS (+)   Anesthesia Other Findings Medical History Previous cataract surgery 03-20-24 Dr. 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  Colon polyps Glaucoma  GERD (gastroesophageal reflux disease) Cardiomyopathy (HCC)  Diastolic dysfunction Anginal pain (HCC)  BPH (benign prostatic hyperplasia) Hx of heart artery stent     Reproductive/Obstetrics negative OB ROS                              Anesthesia Physical Anesthesia Plan  ASA: 3  Anesthesia Plan: MAC   Post-op Pain Management:    Induction: Intravenous  PONV Risk Score and Plan:   Airway Management Planned: Natural Airway and Nasal Cannula  Additional Equipment:   Intra-op Plan:   Post-operative Plan:   Informed Consent: I have reviewed the patients History and Physical, chart, labs and discussed the procedure including the risks, benefits and alternatives for the proposed anesthesia with the patient or authorized representative who has indicated his/her understanding and acceptance.     Dental Advisory Given  Plan Discussed with: Anesthesiologist, CRNA and Surgeon  Anesthesia Plan Comments: (Patient consented for risks of anesthesia including but not limited to:  - adverse reactions to medications - damage to eyes, teeth, lips or other oral mucosa - nerve damage due to  positioning  - sore throat or hoarseness - Damage to heart, brain, nerves, lungs, other parts of body or loss of life  Patient voiced understanding and assent.)         Anesthesia Quick  Evaluation

## 2024-03-27 NOTE — Transfer of Care (Signed)
 Immediate Anesthesia Transfer of Care Note  Patient: Joseph Esparza  Procedure(s) Performed: PHACOEMULSIFICATION, CATARACT, WITH IOL INSERTION (Left: Eye)  Patient Location: PACU  Anesthesia Type: MAC  Level of Consciousness: awake, alert  and patient cooperative  Airway and Oxygen Therapy: Patient Spontanous Breathing and Patient connected to supplemental oxygen  Post-op Assessment: Post-op Vital signs reviewed, Patient's Cardiovascular Status Stable, Respiratory Function Stable, Patent Airway and No signs of Nausea or vomiting  Post-op Vital Signs: Reviewed and stable  Complications: No notable events documented.

## 2024-11-10 ENCOUNTER — Ambulatory Visit: Admission: RE | Admit: 2024-11-10 | Source: Home / Self Care

## 2024-11-10 ENCOUNTER — Encounter: Admission: RE | Payer: Self-pay | Source: Home / Self Care
